# Patient Record
Sex: Male | Born: 1940 | Race: White | Hispanic: No | Marital: Single | Smoking: Former smoker
Health system: Southern US, Community
[De-identification: ages and names within clinical notes are randomized; demographics above are authoritative.]

## PROBLEM LIST (undated history)

## (undated) DIAGNOSIS — I2510 Atherosclerotic heart disease of native coronary artery without angina pectoris: Secondary | ICD-10-CM

## (undated) DIAGNOSIS — F329 Major depressive disorder, single episode, unspecified: Secondary | ICD-10-CM

## (undated) DIAGNOSIS — G4700 Insomnia, unspecified: Secondary | ICD-10-CM

## (undated) DIAGNOSIS — I10 Essential (primary) hypertension: Secondary | ICD-10-CM

## (undated) DIAGNOSIS — M1990 Unspecified osteoarthritis, unspecified site: Secondary | ICD-10-CM

## (undated) DIAGNOSIS — M5126 Other intervertebral disc displacement, lumbar region: Secondary | ICD-10-CM

## (undated) DIAGNOSIS — K635 Polyp of colon: Secondary | ICD-10-CM

## (undated) DIAGNOSIS — E785 Hyperlipidemia, unspecified: Secondary | ICD-10-CM

## (undated) DIAGNOSIS — I219 Acute myocardial infarction, unspecified: Secondary | ICD-10-CM

## (undated) DIAGNOSIS — K219 Gastro-esophageal reflux disease without esophagitis: Secondary | ICD-10-CM

## (undated) DIAGNOSIS — F32A Depression, unspecified: Secondary | ICD-10-CM

## (undated) HISTORY — DX: Insomnia, unspecified: G47.00

## (undated) HISTORY — PX: APPENDECTOMY: SHX54

## (undated) HISTORY — PX: OTHER SURGICAL HISTORY: SHX169

## (undated) HISTORY — DX: Atherosclerotic heart disease of native coronary artery without angina pectoris: I25.10

## (undated) HISTORY — DX: Depression, unspecified: F32.A

## (undated) HISTORY — PX: CARDIAC SURGERY: SHX584

## (undated) HISTORY — DX: Hyperlipidemia, unspecified: E78.5

## (undated) HISTORY — PX: HERNIA REPAIR: SHX51

## (undated) HISTORY — DX: Major depressive disorder, single episode, unspecified: F32.9

## (undated) HISTORY — DX: Polyp of colon: K63.5

---

## 1997-10-11 ENCOUNTER — Ambulatory Visit (HOSPITAL_COMMUNITY): Admission: RE | Admit: 1997-10-11 | Discharge: 1997-10-11 | Payer: Self-pay | Admitting: *Deleted

## 2001-07-10 ENCOUNTER — Ambulatory Visit (HOSPITAL_COMMUNITY): Admission: RE | Admit: 2001-07-10 | Discharge: 2001-07-10 | Payer: Self-pay | Admitting: Family Medicine

## 2001-07-10 ENCOUNTER — Encounter: Payer: Self-pay | Admitting: Family Medicine

## 2001-07-19 ENCOUNTER — Ambulatory Visit (HOSPITAL_COMMUNITY): Admission: RE | Admit: 2001-07-19 | Discharge: 2001-07-19 | Payer: Self-pay | Admitting: Family Medicine

## 2001-07-19 ENCOUNTER — Encounter: Payer: Self-pay | Admitting: Family Medicine

## 2001-10-10 ENCOUNTER — Ambulatory Visit (HOSPITAL_COMMUNITY): Admission: RE | Admit: 2001-10-10 | Discharge: 2001-10-10 | Payer: Self-pay | Admitting: Internal Medicine

## 2002-08-16 ENCOUNTER — Ambulatory Visit (HOSPITAL_COMMUNITY): Admission: RE | Admit: 2002-08-16 | Discharge: 2002-08-16 | Payer: Self-pay | Admitting: Internal Medicine

## 2002-08-16 ENCOUNTER — Encounter (INDEPENDENT_AMBULATORY_CARE_PROVIDER_SITE_OTHER): Payer: Self-pay | Admitting: Internal Medicine

## 2002-08-22 ENCOUNTER — Ambulatory Visit (HOSPITAL_COMMUNITY): Admission: RE | Admit: 2002-08-22 | Discharge: 2002-08-22 | Payer: Self-pay | Admitting: Internal Medicine

## 2002-08-28 ENCOUNTER — Encounter (INDEPENDENT_AMBULATORY_CARE_PROVIDER_SITE_OTHER): Payer: Self-pay | Admitting: Internal Medicine

## 2002-08-28 ENCOUNTER — Ambulatory Visit (HOSPITAL_COMMUNITY): Admission: RE | Admit: 2002-08-28 | Discharge: 2002-08-28 | Payer: Self-pay | Admitting: Internal Medicine

## 2002-10-24 ENCOUNTER — Ambulatory Visit: Admission: RE | Admit: 2002-10-24 | Discharge: 2002-10-24 | Payer: Self-pay | Admitting: Orthopedic Surgery

## 2002-10-24 ENCOUNTER — Encounter: Payer: Self-pay | Admitting: Orthopedic Surgery

## 2002-10-25 ENCOUNTER — Encounter: Payer: Self-pay | Admitting: Orthopedic Surgery

## 2002-10-25 ENCOUNTER — Ambulatory Visit (HOSPITAL_COMMUNITY): Admission: RE | Admit: 2002-10-25 | Discharge: 2002-10-25 | Payer: Self-pay | Admitting: Orthopedic Surgery

## 2002-11-14 ENCOUNTER — Encounter: Admission: RE | Admit: 2002-11-14 | Discharge: 2002-11-14 | Payer: Self-pay | Admitting: Orthopedic Surgery

## 2002-11-14 ENCOUNTER — Encounter: Payer: Self-pay | Admitting: Orthopedic Surgery

## 2002-11-14 ENCOUNTER — Encounter: Payer: Self-pay | Admitting: Diagnostic Radiology

## 2002-12-07 ENCOUNTER — Encounter: Admission: RE | Admit: 2002-12-07 | Discharge: 2002-12-07 | Payer: Self-pay | Admitting: Orthopedic Surgery

## 2002-12-07 ENCOUNTER — Encounter: Payer: Self-pay | Admitting: Orthopedic Surgery

## 2003-01-18 ENCOUNTER — Encounter: Payer: Self-pay | Admitting: Emergency Medicine

## 2003-01-18 ENCOUNTER — Emergency Department (HOSPITAL_COMMUNITY): Admission: EM | Admit: 2003-01-18 | Discharge: 2003-01-18 | Payer: Self-pay | Admitting: Emergency Medicine

## 2003-02-14 ENCOUNTER — Ambulatory Visit (HOSPITAL_COMMUNITY): Admission: RE | Admit: 2003-02-14 | Discharge: 2003-02-14 | Payer: Self-pay | Admitting: Internal Medicine

## 2003-02-15 ENCOUNTER — Ambulatory Visit (HOSPITAL_COMMUNITY): Admission: RE | Admit: 2003-02-15 | Discharge: 2003-02-15 | Payer: Self-pay | Admitting: Family Medicine

## 2003-03-11 ENCOUNTER — Ambulatory Visit (HOSPITAL_COMMUNITY): Admission: RE | Admit: 2003-03-11 | Discharge: 2003-03-11 | Payer: Self-pay | Admitting: Family Medicine

## 2004-04-13 ENCOUNTER — Ambulatory Visit (HOSPITAL_COMMUNITY): Admission: RE | Admit: 2004-04-13 | Discharge: 2004-04-13 | Payer: Self-pay | Admitting: Family Medicine

## 2004-10-05 ENCOUNTER — Ambulatory Visit (HOSPITAL_COMMUNITY)
Admission: RE | Admit: 2004-10-05 | Discharge: 2004-10-05 | Payer: Self-pay | Admitting: Physical Medicine and Rehabilitation

## 2006-11-28 ENCOUNTER — Ambulatory Visit: Payer: Self-pay | Admitting: Orthopedic Surgery

## 2006-12-07 ENCOUNTER — Ambulatory Visit: Payer: Self-pay | Admitting: Internal Medicine

## 2008-09-13 ENCOUNTER — Ambulatory Visit (HOSPITAL_COMMUNITY): Admission: RE | Admit: 2008-09-13 | Discharge: 2008-09-13 | Payer: Self-pay | Admitting: Family Medicine

## 2008-10-04 ENCOUNTER — Ambulatory Visit (HOSPITAL_COMMUNITY): Admission: RE | Admit: 2008-10-04 | Discharge: 2008-10-04 | Payer: Self-pay | Admitting: Urology

## 2008-11-04 ENCOUNTER — Encounter (INDEPENDENT_AMBULATORY_CARE_PROVIDER_SITE_OTHER): Payer: Self-pay | Admitting: *Deleted

## 2008-12-08 ENCOUNTER — Emergency Department (HOSPITAL_COMMUNITY): Admission: EM | Admit: 2008-12-08 | Discharge: 2008-12-08 | Payer: Self-pay | Admitting: Emergency Medicine

## 2008-12-20 DIAGNOSIS — F17200 Nicotine dependence, unspecified, uncomplicated: Secondary | ICD-10-CM

## 2008-12-20 DIAGNOSIS — D126 Benign neoplasm of colon, unspecified: Secondary | ICD-10-CM

## 2008-12-20 DIAGNOSIS — R1310 Dysphagia, unspecified: Secondary | ICD-10-CM | POA: Insufficient documentation

## 2008-12-20 DIAGNOSIS — K5909 Other constipation: Secondary | ICD-10-CM | POA: Insufficient documentation

## 2008-12-20 DIAGNOSIS — M545 Low back pain: Secondary | ICD-10-CM

## 2008-12-20 DIAGNOSIS — Z87448 Personal history of other diseases of urinary system: Secondary | ICD-10-CM

## 2008-12-20 DIAGNOSIS — K219 Gastro-esophageal reflux disease without esophagitis: Secondary | ICD-10-CM

## 2008-12-20 DIAGNOSIS — M519 Unspecified thoracic, thoracolumbar and lumbosacral intervertebral disc disorder: Secondary | ICD-10-CM

## 2009-04-05 DIAGNOSIS — I219 Acute myocardial infarction, unspecified: Secondary | ICD-10-CM

## 2009-04-05 HISTORY — DX: Acute myocardial infarction, unspecified: I21.9

## 2009-04-05 HISTORY — PX: CORONARY ARTERY BYPASS GRAFT: SHX141

## 2009-04-11 ENCOUNTER — Ambulatory Visit (HOSPITAL_COMMUNITY): Admission: RE | Admit: 2009-04-11 | Discharge: 2009-04-11 | Payer: Self-pay | Admitting: Cardiology

## 2009-04-17 ENCOUNTER — Encounter: Payer: Self-pay | Admitting: Cardiothoracic Surgery

## 2009-04-17 ENCOUNTER — Ambulatory Visit: Payer: Self-pay | Admitting: Cardiothoracic Surgery

## 2009-04-17 ENCOUNTER — Inpatient Hospital Stay (HOSPITAL_COMMUNITY): Admission: RE | Admit: 2009-04-17 | Discharge: 2009-04-18 | Payer: Self-pay | Admitting: Cardiology

## 2009-04-17 HISTORY — PX: CARDIAC CATHETERIZATION: SHX172

## 2009-07-14 ENCOUNTER — Emergency Department (HOSPITAL_COMMUNITY): Admission: EM | Admit: 2009-07-14 | Discharge: 2009-07-14 | Payer: Self-pay | Admitting: Emergency Medicine

## 2009-07-17 ENCOUNTER — Ambulatory Visit (HOSPITAL_COMMUNITY): Admission: RE | Admit: 2009-07-17 | Discharge: 2009-07-17 | Payer: Self-pay | Admitting: Cardiology

## 2009-07-23 ENCOUNTER — Ambulatory Visit: Payer: Self-pay | Admitting: Cardiothoracic Surgery

## 2009-08-04 ENCOUNTER — Inpatient Hospital Stay (HOSPITAL_COMMUNITY): Admission: RE | Admit: 2009-08-04 | Discharge: 2009-08-13 | Payer: Self-pay | Admitting: Cardiothoracic Surgery

## 2009-08-04 ENCOUNTER — Ambulatory Visit: Payer: Self-pay | Admitting: Cardiothoracic Surgery

## 2009-08-04 ENCOUNTER — Encounter: Payer: Self-pay | Admitting: Cardiothoracic Surgery

## 2009-09-15 ENCOUNTER — Ambulatory Visit: Payer: Self-pay | Admitting: Cardiothoracic Surgery

## 2009-09-15 ENCOUNTER — Encounter: Admission: RE | Admit: 2009-09-15 | Discharge: 2009-09-15 | Payer: Self-pay | Admitting: Cardiothoracic Surgery

## 2009-10-08 ENCOUNTER — Ambulatory Visit: Payer: Self-pay | Admitting: Cardiothoracic Surgery

## 2009-10-08 ENCOUNTER — Encounter: Admission: RE | Admit: 2009-10-08 | Discharge: 2009-10-08 | Payer: Self-pay | Admitting: Cardiothoracic Surgery

## 2009-10-20 ENCOUNTER — Encounter (HOSPITAL_COMMUNITY): Admission: RE | Admit: 2009-10-20 | Discharge: 2009-11-19 | Payer: Self-pay | Admitting: Cardiology

## 2009-11-19 ENCOUNTER — Encounter (HOSPITAL_COMMUNITY): Admission: RE | Admit: 2009-11-19 | Discharge: 2009-12-19 | Payer: Self-pay | Admitting: Cardiology

## 2009-12-03 ENCOUNTER — Encounter: Admission: RE | Admit: 2009-12-03 | Discharge: 2009-12-03 | Payer: Self-pay | Admitting: Cardiothoracic Surgery

## 2009-12-03 ENCOUNTER — Ambulatory Visit: Payer: Self-pay | Admitting: Cardiothoracic Surgery

## 2009-12-19 ENCOUNTER — Encounter (HOSPITAL_COMMUNITY)
Admission: RE | Admit: 2009-12-19 | Discharge: 2010-01-18 | Payer: Self-pay | Source: Home / Self Care | Admitting: Cardiology

## 2010-01-19 ENCOUNTER — Encounter (HOSPITAL_COMMUNITY)
Admission: RE | Admit: 2010-01-19 | Discharge: 2010-02-18 | Payer: Self-pay | Source: Home / Self Care | Admitting: Cardiology

## 2010-04-25 ENCOUNTER — Encounter: Payer: Self-pay | Admitting: Family Medicine

## 2010-04-26 ENCOUNTER — Encounter: Payer: Self-pay | Admitting: Cardiothoracic Surgery

## 2010-06-07 ENCOUNTER — Emergency Department (HOSPITAL_COMMUNITY): Payer: Medicare Other

## 2010-06-07 ENCOUNTER — Emergency Department (HOSPITAL_COMMUNITY)
Admission: EM | Admit: 2010-06-07 | Discharge: 2010-06-07 | Disposition: A | Discharge status: Home / Self Care | Payer: Medicare Other | Attending: Emergency Medicine | Admitting: Emergency Medicine

## 2010-06-07 DIAGNOSIS — M549 Dorsalgia, unspecified: Secondary | ICD-10-CM | POA: Insufficient documentation

## 2010-06-07 DIAGNOSIS — F1920 Other psychoactive substance dependence, uncomplicated: Secondary | ICD-10-CM | POA: Insufficient documentation

## 2010-06-07 DIAGNOSIS — G8929 Other chronic pain: Secondary | ICD-10-CM | POA: Insufficient documentation

## 2010-06-07 DIAGNOSIS — Z79899 Other long term (current) drug therapy: Secondary | ICD-10-CM | POA: Insufficient documentation

## 2010-06-07 DIAGNOSIS — E785 Hyperlipidemia, unspecified: Secondary | ICD-10-CM | POA: Insufficient documentation

## 2010-06-07 DIAGNOSIS — R110 Nausea: Secondary | ICD-10-CM | POA: Insufficient documentation

## 2010-06-07 DIAGNOSIS — F19939 Other psychoactive substance use, unspecified with withdrawal, unspecified: Secondary | ICD-10-CM | POA: Insufficient documentation

## 2010-06-07 DIAGNOSIS — I2510 Atherosclerotic heart disease of native coronary artery without angina pectoris: Secondary | ICD-10-CM | POA: Insufficient documentation

## 2010-06-07 LAB — URINE MICROSCOPIC-ADD ON

## 2010-06-07 LAB — URINALYSIS, ROUTINE W REFLEX MICROSCOPIC
Bilirubin Urine: NEGATIVE
Glucose, UA: NEGATIVE mg/dL
Specific Gravity, Urine: 1.02 (ref 1.005–1.030)
Urobilinogen, UA: 0.2 mg/dL (ref 0.0–1.0)

## 2010-06-07 LAB — DIFFERENTIAL
Basophils Absolute: 0 10*3/uL (ref 0.0–0.1)
Basophils Relative: 0 % (ref 0–1)
Lymphocytes Relative: 15 % (ref 12–46)
Neutro Abs: 9.6 10*3/uL — ABNORMAL HIGH (ref 1.7–7.7)
Neutrophils Relative %: 79 % — ABNORMAL HIGH (ref 43–77)

## 2010-06-07 LAB — CBC
HCT: 42.6 % (ref 39.0–52.0)
Hemoglobin: 14.6 g/dL (ref 13.0–17.0)
RDW: 14.1 % (ref 11.5–15.5)
WBC: 12.2 10*3/uL — ABNORMAL HIGH (ref 4.0–10.5)

## 2010-06-07 LAB — BASIC METABOLIC PANEL
CO2: 25 mEq/L (ref 19–32)
GFR calc Af Amer: >60 mL/min (ref >60)
GFR calc non Af Amer: >60 mL/min (ref >60)
Glucose, Bld: 128 mg/dL — ABNORMAL HIGH (ref 70–99)
Potassium: 4.2 mEq/L (ref 3.5–5.1)
Sodium: 134 mEq/L — ABNORMAL LOW (ref 135–145)

## 2010-06-07 LAB — POCT CARDIAC MARKERS
CKMB, poc: <1 ng/mL — ABNORMAL LOW (ref 1.0–8.0)
Myoglobin, poc: 38.2 ng/mL (ref 12–200)
Troponin i, poc: 0.16 ng/mL — ABNORMAL HIGH (ref 0.00–0.09)

## 2010-06-07 LAB — CK TOTAL AND CKMB (NOT AT ARMC): CK, MB: 2.2 ng/mL (ref 0.3–4.0)

## 2010-06-21 LAB — CBC
HCT: 42.7 % (ref 39.0–52.0)
Hemoglobin: 14.5 g/dL (ref 13.0–17.0)
MCV: 93.1 fL (ref 78.0–100.0)
Platelets: 221 10*3/uL (ref 150–400)
RBC: 4.59 MIL/uL (ref 4.22–5.81)
WBC: 10.3 10*3/uL (ref 4.0–10.5)

## 2010-06-21 LAB — HEPATIC FUNCTION PANEL
ALT: 18 U/L (ref 0–53)
AST: 16 U/L (ref 0–37)
Albumin: 3.8 g/dL (ref 3.5–5.2)
Bilirubin, Direct: 0.2 mg/dL (ref 0.0–0.3)
Total Protein: 6.8 g/dL (ref 6.0–8.3)

## 2010-06-21 LAB — BLOOD GAS, ARTERIAL
Acid-Base Excess: 0.4 mmol/L (ref 0.0–2.0)
Bicarbonate: 24.5 mEq/L — ABNORMAL HIGH (ref 20.0–24.0)
TCO2: 25.7 mmol/L (ref 0–100)
pCO2 arterial: 39 mmHg (ref 35.0–45.0)
pH, Arterial: 7.414 (ref 7.350–7.450)
pO2, Arterial: 72 mmHg — ABNORMAL LOW (ref 80.0–100.0)

## 2010-06-21 LAB — BASIC METABOLIC PANEL
BUN: 14 mg/dL (ref 6–23)
CO2: 27 mEq/L (ref 19–32)
Chloride: 102 mEq/L (ref 96–112)
Creatinine, Ser: 0.9 mg/dL (ref 0.4–1.5)
Potassium: 4.3 mEq/L (ref 3.5–5.1)

## 2010-06-23 LAB — POCT I-STAT 3, ART BLOOD GAS (G3+)
Acid-Base Excess: 1 mmol/L (ref 0.0–2.0)
Acid-Base Excess: 2 mmol/L (ref 0.0–2.0)
Acid-base deficit: 2 mmol/L (ref 0.0–2.0)
Acid-base deficit: 3 mmol/L — ABNORMAL HIGH (ref 0.0–2.0)
Acid-base deficit: 3 mmol/L — ABNORMAL HIGH (ref 0.0–2.0)
Bicarbonate: 21.1 mEq/L (ref 20.0–24.0)
Bicarbonate: 22.1 mEq/L (ref 20.0–24.0)
Bicarbonate: 24.6 mEq/L — ABNORMAL HIGH (ref 20.0–24.0)
Bicarbonate: 25 mEq/L — ABNORMAL HIGH (ref 20.0–24.0)
Bicarbonate: 26.1 mEq/L — ABNORMAL HIGH (ref 20.0–24.0)
Bicarbonate: 26.3 mEq/L — ABNORMAL HIGH (ref 20.0–24.0)
O2 Saturation: 100 %
O2 Saturation: 100 %
O2 Saturation: 91 %
O2 Saturation: 92 %
O2 Saturation: 95 %
O2 Saturation: 98 %
Patient temperature: 35.1
Patient temperature: 37.5
Patient temperature: 99.1
Patient temperature: 99.8
TCO2: 22 mmol/L (ref 0–100)
TCO2: 23 mmol/L (ref 0–100)
TCO2: 26 mmol/L (ref 0–100)
TCO2: 26 mmol/L (ref 0–100)
TCO2: 27 mmol/L (ref 0–100)
TCO2: 28 mmol/L (ref 0–100)
pCO2 arterial: 29.9 mmHg — ABNORMAL LOW (ref 35.0–45.0)
pCO2 arterial: 35.6 mmHg (ref 35.0–45.0)
pCO2 arterial: 40.6 mmHg (ref 35.0–45.0)
pCO2 arterial: 43.8 mmHg (ref 35.0–45.0)
pCO2 arterial: 44.2 mmHg (ref 35.0–45.0)
pCO2 arterial: 46.5 mmHg — ABNORMAL HIGH (ref 35.0–45.0)
pH, Arterial: 7.345 — ABNORMAL LOW (ref 7.350–7.450)
pH, Arterial: 7.345 — ABNORMAL LOW (ref 7.350–7.450)
pH, Arterial: 7.356 (ref 7.350–7.450)
pH, Arterial: 7.383 (ref 7.350–7.450)
pH, Arterial: 7.386 (ref 7.350–7.450)
pH, Arterial: 7.531 — ABNORMAL HIGH (ref 7.350–7.450)
pO2, Arterial: 100 mmHg (ref 80.0–100.0)
pO2, Arterial: 306 mmHg — ABNORMAL HIGH (ref 80.0–100.0)
pO2, Arterial: 351 mmHg — ABNORMAL HIGH (ref 80.0–100.0)
pO2, Arterial: 62 mmHg — ABNORMAL LOW (ref 80.0–100.0)
pO2, Arterial: 65 mmHg — ABNORMAL LOW (ref 80.0–100.0)
pO2, Arterial: 83 mmHg (ref 80.0–100.0)

## 2010-06-23 LAB — APTT
aPTT: 32 seconds (ref 24–37)
aPTT: 30 seconds (ref 24–37)

## 2010-06-23 LAB — CROSSMATCH
ABO/RH(D): A POS
Antibody Screen: NEGATIVE

## 2010-06-23 LAB — URINE CULTURE
Colony Count: NO GROWTH
Culture: NO GROWTH

## 2010-06-23 LAB — POCT I-STAT 4, (NA,K, GLUC, HGB,HCT)
Glucose, Bld: 114 mg/dL — ABNORMAL HIGH (ref 70–99)
Glucose, Bld: 133 mg/dL — ABNORMAL HIGH (ref 70–99)
Glucose, Bld: 147 mg/dL — ABNORMAL HIGH (ref 70–99)
Glucose, Bld: 87 mg/dL (ref 70–99)
Glucose, Bld: 92 mg/dL (ref 70–99)
Glucose, Bld: 96 mg/dL (ref 70–99)
HCT: 29 % — ABNORMAL LOW (ref 39.0–52.0)
HCT: 31 % — ABNORMAL LOW (ref 39.0–52.0)
HCT: 32 % — ABNORMAL LOW (ref 39.0–52.0)
HCT: 35 % — ABNORMAL LOW (ref 39.0–52.0)
HCT: 36 % — ABNORMAL LOW (ref 39.0–52.0)
HCT: 39 % (ref 39.0–52.0)
Hemoglobin: 10.5 g/dL — ABNORMAL LOW (ref 13.0–17.0)
Hemoglobin: 10.9 g/dL — ABNORMAL LOW (ref 13.0–17.0)
Hemoglobin: 11.9 g/dL — ABNORMAL LOW (ref 13.0–17.0)
Hemoglobin: 12.2 g/dL — ABNORMAL LOW (ref 13.0–17.0)
Hemoglobin: 13.3 g/dL (ref 13.0–17.0)
Hemoglobin: 9.9 g/dL — ABNORMAL LOW (ref 13.0–17.0)
Potassium: 3.1 mEq/L — ABNORMAL LOW (ref 3.5–5.1)
Potassium: 3.6 mEq/L (ref 3.5–5.1)
Potassium: 3.9 mEq/L (ref 3.5–5.1)
Potassium: 4 mEq/L (ref 3.5–5.1)
Potassium: 4.3 mEq/L (ref 3.5–5.1)
Potassium: 4.3 mEq/L (ref 3.5–5.1)
Sodium: 131 mEq/L — ABNORMAL LOW (ref 135–145)
Sodium: 134 mEq/L — ABNORMAL LOW (ref 135–145)
Sodium: 136 mEq/L (ref 135–145)
Sodium: 136 mEq/L (ref 135–145)
Sodium: 137 mEq/L (ref 135–145)
Sodium: 139 mEq/L (ref 135–145)

## 2010-06-23 LAB — COMPREHENSIVE METABOLIC PANEL
ALT: 20 U/L (ref 0–53)
AST: 17 U/L (ref 0–37)
Albumin: 3.6 g/dL (ref 3.5–5.2)
Alkaline Phosphatase: 66 U/L (ref 39–117)
BUN: 10 mg/dL (ref 6–23)
CO2: 24 mEq/L (ref 19–32)
Calcium: 9.2 mg/dL (ref 8.4–10.5)
Chloride: 105 mEq/L (ref 96–112)
Creatinine, Ser: 0.97 mg/dL (ref 0.4–1.5)
GFR calc Af Amer: >60 mL/min (ref >60)
GFR calc non Af Amer: >60 mL/min (ref >60)
Glucose, Bld: 94 mg/dL (ref 70–99)
Potassium: 4.6 mEq/L (ref 3.5–5.1)
Sodium: 134 mEq/L — ABNORMAL LOW (ref 135–145)
Total Bilirubin: 0.3 mg/dL (ref 0.3–1.2)
Total Protein: 6.2 g/dL (ref 6.0–8.3)

## 2010-06-23 LAB — URINALYSIS, ROUTINE W REFLEX MICROSCOPIC
Bilirubin Urine: NEGATIVE
Bilirubin Urine: NEGATIVE
Glucose, UA: NEGATIVE mg/dL
Glucose, UA: NEGATIVE mg/dL
Ketones, ur: 40 mg/dL — AB
Ketones, ur: NEGATIVE mg/dL
Leukocytes, UA: NEGATIVE
Nitrite: NEGATIVE
Nitrite: NEGATIVE
Protein, ur: 100 mg/dL — AB
Protein, ur: NEGATIVE mg/dL
Specific Gravity, Urine: 1.028 (ref 1.005–1.030)
Specific Gravity, Urine: 1.011 (ref 1.005–1.030)
Urobilinogen, UA: 0.2 mg/dL (ref 0.0–1.0)
Urobilinogen, UA: 0.2 mg/dL (ref 0.0–1.0)
pH: 5.5 (ref 5.0–8.0)
pH: 6.5 (ref 5.0–8.0)

## 2010-06-23 LAB — CBC
HCT: 24.1 % — ABNORMAL LOW (ref 39.0–52.0)
HCT: 24.9 % — ABNORMAL LOW (ref 39.0–52.0)
HCT: 26.4 % — ABNORMAL LOW (ref 39.0–52.0)
HCT: 27.8 % — ABNORMAL LOW (ref 39.0–52.0)
HCT: 29.3 % — ABNORMAL LOW (ref 39.0–52.0)
HCT: 29.3 % — ABNORMAL LOW (ref 39.0–52.0)
HCT: 30.8 % — ABNORMAL LOW (ref 39.0–52.0)
HCT: 36.2 % — ABNORMAL LOW (ref 39.0–52.0)
HCT: 40.7 % (ref 39.0–52.0)
Hemoglobin: 10.1 g/dL — ABNORMAL LOW (ref 13.0–17.0)
Hemoglobin: 10.3 g/dL — ABNORMAL LOW (ref 13.0–17.0)
Hemoglobin: 10.6 g/dL — ABNORMAL LOW (ref 13.0–17.0)
Hemoglobin: 12.6 g/dL — ABNORMAL LOW (ref 13.0–17.0)
Hemoglobin: 8.2 g/dL — ABNORMAL LOW (ref 13.0–17.0)
Hemoglobin: 8.3 g/dL — ABNORMAL LOW (ref 13.0–17.0)
Hemoglobin: 8.4 g/dL — ABNORMAL LOW (ref 13.0–17.0)
Hemoglobin: 9.1 g/dL — ABNORMAL LOW (ref 13.0–17.0)
Hemoglobin: 9.7 g/dL — ABNORMAL LOW (ref 13.0–17.0)
Hemoglobin: 14.3 g/dL (ref 13.0–17.0)
MCHC: 33.6 g/dL (ref 30.0–36.0)
MCHC: 34 g/dL (ref 30.0–36.0)
MCHC: 34.3 g/dL (ref 30.0–36.0)
MCHC: 34.3 g/dL (ref 30.0–36.0)
MCHC: 34.3 g/dL (ref 30.0–36.0)
MCHC: 34.6 g/dL (ref 30.0–36.0)
MCHC: 34.7 g/dL (ref 30.0–36.0)
MCHC: 34.8 g/dL (ref 30.0–36.0)
MCHC: 35.2 g/dL (ref 30.0–36.0)
MCHC: 35.2 g/dL (ref 30.0–36.0)
MCV: 92.2 fL (ref 78.0–100.0)
MCV: 92.5 fL (ref 78.0–100.0)
MCV: 92.8 fL (ref 78.0–100.0)
MCV: 92.9 fL (ref 78.0–100.0)
MCV: 93 fL (ref 78.0–100.0)
MCV: 93.2 fL (ref 78.0–100.0)
MCV: 93.3 fL (ref 78.0–100.0)
MCV: 93.6 fL (ref 78.0–100.0)
MCV: 94.1 fL (ref 78.0–100.0)
MCV: 92.6 fL (ref 78.0–100.0)
Platelets: 153 10*3/uL (ref 150–400)
Platelets: 162 10*3/uL (ref 150–400)
Platelets: 174 10*3/uL (ref 150–400)
Platelets: 194 10*3/uL (ref 150–400)
Platelets: 200 10*3/uL (ref 150–400)
Platelets: 206 10*3/uL (ref 150–400)
Platelets: 210 10*3/uL (ref 150–400)
Platelets: 229 10*3/uL (ref 150–400)
Platelets: 360 10*3/uL (ref 150–400)
Platelets: 249 10*3/uL (ref 150–400)
RBC: 2.57 MIL/uL — ABNORMAL LOW (ref 4.22–5.81)
RBC: 2.58 MIL/uL — ABNORMAL LOW (ref 4.22–5.81)
RBC: 2.67 MIL/uL — ABNORMAL LOW (ref 4.22–5.81)
RBC: 2.83 MIL/uL — ABNORMAL LOW (ref 4.22–5.81)
RBC: 2.97 MIL/uL — ABNORMAL LOW (ref 4.22–5.81)
RBC: 3.02 MIL/uL — ABNORMAL LOW (ref 4.22–5.81)
RBC: 3.15 MIL/uL — ABNORMAL LOW (ref 4.22–5.81)
RBC: 3.16 MIL/uL — ABNORMAL LOW (ref 4.22–5.81)
RBC: 3.32 MIL/uL — ABNORMAL LOW (ref 4.22–5.81)
RBC: 3.92 MIL/uL — ABNORMAL LOW (ref 4.22–5.81)
RBC: 4.39 MIL/uL (ref 4.22–5.81)
RDW: 13.2 % (ref 11.5–15.5)
RDW: 13.4 % (ref 11.5–15.5)
RDW: 13.4 % (ref 11.5–15.5)
RDW: 13.5 % (ref 11.5–15.5)
RDW: 13.6 % (ref 11.5–15.5)
RDW: 13.7 % (ref 11.5–15.5)
RDW: 13.8 % (ref 11.5–15.5)
RDW: 13.9 % (ref 11.5–15.5)
RDW: 13.6 % (ref 11.5–15.5)
WBC: 10 10*3/uL (ref 4.0–10.5)
WBC: 10.4 10*3/uL (ref 4.0–10.5)
WBC: 14.9 10*3/uL — ABNORMAL HIGH (ref 4.0–10.5)
WBC: 15.3 10*3/uL — ABNORMAL HIGH (ref 4.0–10.5)
WBC: 16.5 10*3/uL — ABNORMAL HIGH (ref 4.0–10.5)
WBC: 18.1 10*3/uL — ABNORMAL HIGH (ref 4.0–10.5)
WBC: 18.3 10*3/uL — ABNORMAL HIGH (ref 4.0–10.5)
WBC: 21.8 10*3/uL — ABNORMAL HIGH (ref 4.0–10.5)
WBC: 8.5 10*3/uL (ref 4.0–10.5)
WBC: 8.6 10*3/uL (ref 4.0–10.5)
WBC: 8.8 10*3/uL (ref 4.0–10.5)

## 2010-06-23 LAB — BASIC METABOLIC PANEL
BUN: 10 mg/dL (ref 6–23)
BUN: 12 mg/dL (ref 6–23)
BUN: 12 mg/dL (ref 6–23)
BUN: 16 mg/dL (ref 6–23)
BUN: 21 mg/dL (ref 6–23)
BUN: 23 mg/dL (ref 6–23)
CO2: 22 mEq/L (ref 19–32)
CO2: 25 mEq/L (ref 19–32)
CO2: 26 mEq/L (ref 19–32)
CO2: 27 mEq/L (ref 19–32)
CO2: 28 mEq/L (ref 19–32)
Calcium: 7.7 mg/dL — ABNORMAL LOW (ref 8.4–10.5)
Calcium: 7.8 mg/dL — ABNORMAL LOW (ref 8.4–10.5)
Calcium: 7.9 mg/dL — ABNORMAL LOW (ref 8.4–10.5)
Calcium: 8 mg/dL — ABNORMAL LOW (ref 8.4–10.5)
Calcium: 8 mg/dL — ABNORMAL LOW (ref 8.4–10.5)
Calcium: 8.2 mg/dL — ABNORMAL LOW (ref 8.4–10.5)
Calcium: 8.8 mg/dL (ref 8.4–10.5)
Chloride: 103 mEq/L (ref 96–112)
Chloride: 103 mEq/L (ref 96–112)
Chloride: 104 mEq/L (ref 96–112)
Chloride: 104 mEq/L (ref 96–112)
Chloride: 107 mEq/L (ref 96–112)
Chloride: 110 mEq/L (ref 96–112)
Creatinine, Ser: 0.74 mg/dL (ref 0.4–1.5)
Creatinine, Ser: 0.81 mg/dL (ref 0.4–1.5)
Creatinine, Ser: 0.83 mg/dL (ref 0.4–1.5)
Creatinine, Ser: 0.84 mg/dL (ref 0.4–1.5)
Creatinine, Ser: 0.85 mg/dL (ref 0.4–1.5)
Creatinine, Ser: 0.88 mg/dL (ref 0.4–1.5)
Creatinine, Ser: 0.89 mg/dL (ref 0.4–1.5)
GFR calc Af Amer: >60 mL/min (ref >60)
GFR calc Af Amer: >60 mL/min (ref >60)
GFR calc Af Amer: >60 mL/min (ref >60)
GFR calc Af Amer: >60 mL/min (ref >60)
GFR calc Af Amer: >60 mL/min (ref >60)
GFR calc Af Amer: >60 mL/min (ref >60)
GFR calc Af Amer: >60 mL/min (ref >60)
GFR calc non Af Amer: >60 mL/min (ref >60)
GFR calc non Af Amer: >60 mL/min (ref >60)
GFR calc non Af Amer: >60 mL/min (ref >60)
GFR calc non Af Amer: >60 mL/min (ref >60)
GFR calc non Af Amer: >60 mL/min (ref >60)
GFR calc non Af Amer: >60 mL/min (ref >60)
Glucose, Bld: 112 mg/dL — ABNORMAL HIGH (ref 70–99)
Glucose, Bld: 115 mg/dL — ABNORMAL HIGH (ref 70–99)
Glucose, Bld: 123 mg/dL — ABNORMAL HIGH (ref 70–99)
Glucose, Bld: 124 mg/dL — ABNORMAL HIGH (ref 70–99)
Glucose, Bld: 149 mg/dL — ABNORMAL HIGH (ref 70–99)
Potassium: 2.8 mEq/L — ABNORMAL LOW (ref 3.5–5.1)
Potassium: 3 mEq/L — ABNORMAL LOW (ref 3.5–5.1)
Potassium: 3.2 mEq/L — ABNORMAL LOW (ref 3.5–5.1)
Potassium: 4 mEq/L (ref 3.5–5.1)
Potassium: 4.2 mEq/L (ref 3.5–5.1)
Sodium: 134 mEq/L — ABNORMAL LOW (ref 135–145)
Sodium: 136 mEq/L (ref 135–145)
Sodium: 137 mEq/L (ref 135–145)
Sodium: 137 mEq/L (ref 135–145)
Sodium: 138 mEq/L (ref 135–145)
Sodium: 139 mEq/L (ref 135–145)

## 2010-06-23 LAB — URINE MICROSCOPIC-ADD ON

## 2010-06-23 LAB — PREPARE PLATELETS

## 2010-06-23 LAB — EXPECTORATED SPUTUM ASSESSMENT W GRAM STAIN, RFLX TO RESP C

## 2010-06-23 LAB — GLUCOSE, CAPILLARY
Glucose-Capillary: 100 mg/dL — ABNORMAL HIGH (ref 70–99)
Glucose-Capillary: 104 mg/dL — ABNORMAL HIGH (ref 70–99)
Glucose-Capillary: 108 mg/dL — ABNORMAL HIGH (ref 70–99)
Glucose-Capillary: 108 mg/dL — ABNORMAL HIGH (ref 70–99)
Glucose-Capillary: 110 mg/dL — ABNORMAL HIGH (ref 70–99)
Glucose-Capillary: 114 mg/dL — ABNORMAL HIGH (ref 70–99)
Glucose-Capillary: 115 mg/dL — ABNORMAL HIGH (ref 70–99)
Glucose-Capillary: 121 mg/dL — ABNORMAL HIGH (ref 70–99)
Glucose-Capillary: 122 mg/dL — ABNORMAL HIGH (ref 70–99)
Glucose-Capillary: 122 mg/dL — ABNORMAL HIGH (ref 70–99)
Glucose-Capillary: 123 mg/dL — ABNORMAL HIGH (ref 70–99)
Glucose-Capillary: 123 mg/dL — ABNORMAL HIGH (ref 70–99)
Glucose-Capillary: 125 mg/dL — ABNORMAL HIGH (ref 70–99)
Glucose-Capillary: 125 mg/dL — ABNORMAL HIGH (ref 70–99)
Glucose-Capillary: 128 mg/dL — ABNORMAL HIGH (ref 70–99)
Glucose-Capillary: 129 mg/dL — ABNORMAL HIGH (ref 70–99)
Glucose-Capillary: 129 mg/dL — ABNORMAL HIGH (ref 70–99)
Glucose-Capillary: 129 mg/dL — ABNORMAL HIGH (ref 70–99)
Glucose-Capillary: 132 mg/dL — ABNORMAL HIGH (ref 70–99)
Glucose-Capillary: 133 mg/dL — ABNORMAL HIGH (ref 70–99)
Glucose-Capillary: 135 mg/dL — ABNORMAL HIGH (ref 70–99)
Glucose-Capillary: 136 mg/dL — ABNORMAL HIGH (ref 70–99)
Glucose-Capillary: 136 mg/dL — ABNORMAL HIGH (ref 70–99)
Glucose-Capillary: 137 mg/dL — ABNORMAL HIGH (ref 70–99)
Glucose-Capillary: 137 mg/dL — ABNORMAL HIGH (ref 70–99)
Glucose-Capillary: 137 mg/dL — ABNORMAL HIGH (ref 70–99)
Glucose-Capillary: 141 mg/dL — ABNORMAL HIGH (ref 70–99)
Glucose-Capillary: 150 mg/dL — ABNORMAL HIGH (ref 70–99)
Glucose-Capillary: 154 mg/dL — ABNORMAL HIGH (ref 70–99)
Glucose-Capillary: 157 mg/dL — ABNORMAL HIGH (ref 70–99)
Glucose-Capillary: 170 mg/dL — ABNORMAL HIGH (ref 70–99)
Glucose-Capillary: 172 mg/dL — ABNORMAL HIGH (ref 70–99)
Glucose-Capillary: 196 mg/dL — ABNORMAL HIGH (ref 70–99)
Glucose-Capillary: 90 mg/dL (ref 70–99)

## 2010-06-23 LAB — PROTIME-INR
INR: 1.37 (ref 0.00–1.49)
INR: 0.99 (ref 0.00–1.49)
Prothrombin Time: 16.8 seconds — ABNORMAL HIGH (ref 11.6–15.2)
Prothrombin Time: 13 seconds (ref 11.6–15.2)

## 2010-06-23 LAB — POCT I-STAT, CHEM 8
BUN: 10 mg/dL (ref 6–23)
BUN: 12 mg/dL (ref 6–23)
Calcium, Ion: 1.14 mmol/L (ref 1.12–1.32)
Calcium, Ion: 1.15 mmol/L (ref 1.12–1.32)
Chloride: 103 mEq/L (ref 96–112)
Chloride: 108 mEq/L (ref 96–112)
Creatinine, Ser: 0.8 mg/dL (ref 0.4–1.5)
Creatinine, Ser: 0.9 mg/dL (ref 0.4–1.5)
Glucose, Bld: 120 mg/dL — ABNORMAL HIGH (ref 70–99)
Glucose, Bld: 128 mg/dL — ABNORMAL HIGH (ref 70–99)
HCT: 29 % — ABNORMAL LOW (ref 39.0–52.0)
HCT: 30 % — ABNORMAL LOW (ref 39.0–52.0)
Hemoglobin: 10.2 g/dL — ABNORMAL LOW (ref 13.0–17.0)
Hemoglobin: 9.9 g/dL — ABNORMAL LOW (ref 13.0–17.0)
Potassium: 4.1 mEq/L (ref 3.5–5.1)
Potassium: 4.4 mEq/L (ref 3.5–5.1)
Sodium: 136 mEq/L (ref 135–145)
Sodium: 136 mEq/L (ref 135–145)
TCO2: 21 mmol/L (ref 0–100)
TCO2: 23 mmol/L (ref 0–100)

## 2010-06-23 LAB — CULTURE, RESPIRATORY W GRAM STAIN: Culture: NORMAL

## 2010-06-23 LAB — BLOOD GAS, ARTERIAL
Acid-Base Excess: 0.4 mmol/L (ref 0.0–2.0)
Bicarbonate: 24.6 mEq/L — ABNORMAL HIGH (ref 20.0–24.0)
Drawn by: 206361
FIO2: 0.21 %
O2 Saturation: 97.8 %
Patient temperature: 98.6
TCO2: 25.8 mmol/L (ref 0–100)
pCO2 arterial: 39.7 mmHg (ref 35.0–45.0)
pH, Arterial: 7.408 (ref 7.350–7.450)
pO2, Arterial: 92.4 mmHg (ref 80.0–100.0)

## 2010-06-23 LAB — CREATININE, SERUM
Creatinine, Ser: 0.85 mg/dL (ref 0.4–1.5)
Creatinine, Ser: 0.92 mg/dL (ref 0.4–1.5)
GFR calc Af Amer: >60 mL/min (ref >60)
GFR calc Af Amer: >60 mL/min (ref >60)
GFR calc non Af Amer: >60 mL/min (ref >60)
GFR calc non Af Amer: >60 mL/min (ref >60)

## 2010-06-23 LAB — PLATELET COUNT: Platelets: 136 10*3/uL — ABNORMAL LOW (ref 150–400)

## 2010-06-23 LAB — HEMOGLOBIN AND HEMATOCRIT, BLOOD
HCT: 32.1 % — ABNORMAL LOW (ref 39.0–52.0)
Hemoglobin: 11 g/dL — ABNORMAL LOW (ref 13.0–17.0)

## 2010-06-23 LAB — WOUND CULTURE: Culture: NO GROWTH

## 2010-06-23 LAB — MAGNESIUM
Magnesium: 2.4 mg/dL (ref 1.5–2.5)
Magnesium: 2.5 mg/dL (ref 1.5–2.5)
Magnesium: 2.7 mg/dL — ABNORMAL HIGH (ref 1.5–2.5)

## 2010-06-23 LAB — HEMOGLOBIN A1C
Hgb A1c MFr Bld: 5.9 % — ABNORMAL HIGH (ref <5.7)
Mean Plasma Glucose: 123 mg/dL — ABNORMAL HIGH (ref <117)

## 2010-06-23 LAB — SURGICAL PCR SCREEN
MRSA, PCR: NEGATIVE
Staphylococcus aureus: NEGATIVE

## 2010-06-23 LAB — ABO/RH: ABO/RH(D): A POS

## 2010-06-24 LAB — URINALYSIS, ROUTINE W REFLEX MICROSCOPIC
Bilirubin Urine: NEGATIVE
Ketones, ur: NEGATIVE mg/dL
Nitrite: NEGATIVE
Urobilinogen, UA: 0.2 mg/dL (ref 0.0–1.0)

## 2010-06-24 LAB — DIFFERENTIAL
Basophils Absolute: 0 10*3/uL (ref 0.0–0.1)
Basophils Relative: 0 % (ref 0–1)
Eosinophils Relative: 3 % (ref 0–5)
Monocytes Absolute: 0.7 10*3/uL (ref 0.1–1.0)

## 2010-06-24 LAB — CBC
HCT: 40.5 % (ref 39.0–52.0)
Hemoglobin: 14.1 g/dL (ref 13.0–17.0)
MCHC: 34.8 g/dL (ref 30.0–36.0)
RDW: 12.9 % (ref 11.5–15.5)

## 2010-06-24 LAB — BASIC METABOLIC PANEL
CO2: 24 mEq/L (ref 19–32)
Glucose, Bld: 111 mg/dL — ABNORMAL HIGH (ref 70–99)
Potassium: 4 mEq/L (ref 3.5–5.1)
Sodium: 131 mEq/L — ABNORMAL LOW (ref 135–145)

## 2010-06-24 LAB — POCT CARDIAC MARKERS: Myoglobin, poc: 92.9 ng/mL (ref 12–200)

## 2010-06-29 ENCOUNTER — Other Ambulatory Visit (HOSPITAL_COMMUNITY): Payer: Self-pay | Admitting: Internal Medicine

## 2010-07-10 LAB — CBC
HCT: 44.9 % (ref 39.0–52.0)
MCV: 92.2 fL (ref 78.0–100.0)
RBC: 4.87 MIL/uL (ref 4.22–5.81)
WBC: 9.3 10*3/uL (ref 4.0–10.5)

## 2010-07-10 LAB — COMPREHENSIVE METABOLIC PANEL
AST: 19 U/L (ref 0–37)
Alkaline Phosphatase: 70 U/L (ref 39–117)
CO2: 25 mEq/L (ref 19–32)
Chloride: 102 mEq/L (ref 96–112)
Creatinine, Ser: 0.8 mg/dL (ref 0.4–1.5)
GFR calc Af Amer: >60 mL/min (ref >60)
GFR calc non Af Amer: >60 mL/min (ref >60)
Potassium: 4 mEq/L (ref 3.5–5.1)
Total Bilirubin: 1 mg/dL (ref 0.3–1.2)

## 2010-07-10 LAB — POCT CARDIAC MARKERS
Troponin i, poc: <0.05 ng/mL (ref 0.00–0.09)
Troponin i, poc: <0.05 ng/mL (ref 0.00–0.09)

## 2010-07-10 LAB — DIFFERENTIAL
Basophils Absolute: 0 10*3/uL (ref 0.0–0.1)
Basophils Relative: 0 % (ref 0–1)
Eosinophils Absolute: 0.2 10*3/uL (ref 0.0–0.7)
Eosinophils Relative: 2 % (ref 0–5)
Lymphocytes Relative: 20 % (ref 12–46)

## 2010-08-03 ENCOUNTER — Other Ambulatory Visit (HOSPITAL_COMMUNITY): Payer: Self-pay | Admitting: Urology

## 2010-08-03 DIAGNOSIS — N400 Enlarged prostate without lower urinary tract symptoms: Secondary | ICD-10-CM

## 2010-08-03 DIAGNOSIS — R32 Unspecified urinary incontinence: Secondary | ICD-10-CM

## 2010-08-03 DIAGNOSIS — R109 Unspecified abdominal pain: Secondary | ICD-10-CM

## 2010-08-05 ENCOUNTER — Ambulatory Visit (HOSPITAL_COMMUNITY)
Admission: RE | Admit: 2010-08-05 | Discharge: 2010-08-05 | Disposition: A | Discharge status: Home / Self Care | Payer: Medicare Other | Source: Ambulatory Visit | Attending: Urology | Admitting: Urology

## 2010-08-05 ENCOUNTER — Encounter (HOSPITAL_COMMUNITY): Payer: Self-pay

## 2010-08-05 DIAGNOSIS — R32 Unspecified urinary incontinence: Secondary | ICD-10-CM

## 2010-08-05 DIAGNOSIS — N400 Enlarged prostate without lower urinary tract symptoms: Secondary | ICD-10-CM

## 2010-08-05 DIAGNOSIS — I10 Essential (primary) hypertension: Secondary | ICD-10-CM | POA: Insufficient documentation

## 2010-08-05 DIAGNOSIS — R109 Unspecified abdominal pain: Secondary | ICD-10-CM | POA: Insufficient documentation

## 2010-08-05 DIAGNOSIS — R350 Frequency of micturition: Secondary | ICD-10-CM | POA: Insufficient documentation

## 2010-08-05 DIAGNOSIS — R3129 Other microscopic hematuria: Secondary | ICD-10-CM | POA: Insufficient documentation

## 2010-08-05 HISTORY — DX: Essential (primary) hypertension: I10

## 2010-08-05 MED ORDER — IOHEXOL 300 MG/ML  SOLN
125.0000 mL | Freq: Once | INTRAMUSCULAR | Status: AC | PRN
  Administered 2010-08-05: 125 mL via INTRAVENOUS

## 2010-08-18 NOTE — Assessment & Plan Note (Signed)
OFFICE VISIT   Rodney Decker, Rodney Decker  DOB:  05/22/1940                                        September 15, 2009  CHART #:  [id number]   HISTORY:  The patient is a 70 year old white male status post coronary  artery bypass grafting x5 as well as coronary endarterectomy of the  right coronary artery on Aug 04, 2009, by Dr. Donata Clay for severe  multivessel coronary artery disease with unstable angina.  Postoperatively, he did have some difficulties with delirium.  He was  seen on today's date in the office and routine postoperative followup.  Currently, he reports that in general, he is feeling better but does  have both have good days and bad days.  This relates to multiple  issues as far as his energy level, his appetite, and his sense of  anxiety.  He denies any fevers, chills or other constitutional symptoms.  He denies anginal pain or equivalent.  He denies shortness of breath.  He does note some occasional clicking in his sternum, but this is  relatively rare.   Chest x-ray was obtained on today's date.  It shows some possible  migration of the fifth sternal wire.  There was no significant  effusions, infiltrates or findings consistent with congestive failure.   PHYSICAL EXAMINATION:  Vital Signs:  Blood pressure is 114/79, pulse 90,  respirations 18, oxygen saturation is 96% on room air.  General  Appearance:  Well-developed adult male in no acute distress.  Pulmonary:  Clear lungs bilaterally.  Cardiac:  Regular rate and rhythm.  Normal S1  and S2.  Incisions are inspected.  There is a mid sternal click with  cough.  There is no evidence of infection.  No drainage.  No erythema.  Extremities:  Trace bilateral lower extremity edema slightly more so on  the right side.   ASSESSMENT:  Mr. Baggerly is making adequate recovery following his  surgical revascularization.  He has been placed on Lasix by his primary  cardiologist.  There is some concern as it  relates to the sternal  incision due to the slight mobility with cough.  We have encouraged him  to continue significant sternal precautions with no lifting more than 5  pounds.  He can begin driving short distances.  We will see him again in the  office in 2 weeks with a chest x-ray.   Rowe Clack, P.A.-C.   Sherryll Burger  D:  09/15/2009  T:  09/16/2009  Job:  [id number]   cc:   Kerin Perna, M.D.  Sheliah Mends, MD

## 2010-08-18 NOTE — Consult Note (Signed)
NEW PATIENT CONSULTATION   Rodney Decker, Rodney Decker  DOB:  [birth year]                                        July 23, 2009  CHART #:  [id number]   CURRENT PROBLEMS:  1. Severe three-vessel coronary artery disease with moderate left main      stenosis.  2. Hypertension.  3. History of gastroesophageal reflux disease, hiatal hernia.  4. Chronic back pain, history of thoracic herniated disk.  5. Benign prostatic hypertrophy.  6. No known drug allergies.   PRESENT ILLNESS:  The patient is a 70 year old Caucasian male ex-smoker  who presents for surgical coronary revascularization.  He presented to  the hospital with unstable angina in January this year and underwent  cardiac catheterization by Dr. Garen Lah.  This demonstrated moderate  left main stenosis with severe three-vessel disease and preserved LV  function.  Surgical revascularization was recommended; however, he was  not able to proceed with surgery due to health care arrangements for  providing care to his 69 year old mother.  At this point, she is in a  nursing home and being taken care of and he can proceed with surgery and  he wishes to schedule this as soon as possible.  He is having some  intermittent angina now and takes nitroglycerin once or twice a week  with immediate relief of symptoms.  He is becoming more anxious and he  has been taking Ativan.  He has been having difficulty sleeping.  He has  some dyspnea on exertion and walks to the nursing home to visit his  mother using a walker.   At the time of his hospitalization in January, he had a moderate left  main stenosis with total occlusion of the LAD, high-grade stenosis of  the diagonal, moderate stenosis of a large OM, and a tight stenosis of a  smaller OM-1, and he had a large dominant right coronary with moderate  60% stenosis.  EF was normal and 2-Decker echo was performed which showed no  significant valvular abnormalities.  He did have his  pre-CABG Dopplers  which showed no carotid artery stenosis of significance.  The patient  does have a history of varicose veins which appeared to be mild and  equal in each lower extremity.   HOME MEDICATIONS:  1. Lisinopril 10 mg daily.  2. Ativan 1 p.o. b.i.Decker. p.r.n.  3. Pravastatin 30 mg at bedtime.  4. Metoprolol 25 mg b.i.Decker.  5. Aspirin 81 mg daily.  6. Oxycodone p.r.n. pain.   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  He does not smoke or use alcohol.  He lives  alone and still supports his mother during her current nursing home stay  following a fall and broken hip and hip replacement.   FAMILY HISTORY:  Positive for coronary disease in his family, negative  for diabetes.   REVIEW OF SYSTEMS:  CONSTITUTIONAL:  Negative for fever or weight loss.  ENT:  Significant for a loose dental implant in the left upper incisor.  Otherwise, he has veneered upper teeth.  THORAX:  Negative for history of trauma, hemoptysis, pneumothorax, or  rib fracture.  His last chest x-ray showed no active disease earlier  this year.  GI:  Positive for GERD.  No history of gallstones or blood per rectum.  UROLOGIC:  Positive for BPH.  VASCULAR:  Negative DVT, claudication, or TIA.  ENDOCRINE:  Negative for diabetes.  HEMATOLOGIC:  Negative for prior blood transfusion or bleeding disorder.   PHYSICAL EXAMINATION:  Vital Signs:  The patient is 5 feet 11 inches and  weighs 190 pounds.  Blood pressure is 110/76, pulse 70, respirations 18,  saturation 95% on room.  He is alert but anxious middle-aged Caucasian  male in no acute distress.  HEENT:  Normocephalic.  Pupils are equal.  Neck:  Without JVD, mass, or bruit.  Lymphatics:  No palpable adenopathy  of the neck or supraclavicular fossa.  Breath sounds are clear and  equal.  There is no thoracic deformity.  Cardiac:  Regular rhythm  without S3 gallop or murmur.  Abdomen:  Soft, nontender without   organomegaly or pulsatile mass.  Extremities:  Mild clubbing and no  cyanosis, edema, or tenderness.  Peripheral pulses are 2+ in all  extremities.  He has mild varicosities, mainly spider veins in his legs  from the knee distally.  Neurologic:  Alert and oriented without focal  motor deficit.   LABORATORY DATA:  I reviewed the coronary arteriograms performed by Dr.  Garen Lah in January which demonstrates severe multivessel coronary  disease with moderate left main stenosis, high-grade LAD diagonal  stenosis, high-grade circumflex disease, and moderate stenosis of the  right coronary circulation.  His systolic function is preserved.   The patient would benefit from surgical revascularization for relief of  symptoms, preservation of LV function, and improved survival.  This will  be scheduled for Monday, Aug 04, 2009, as the patient wishes to delay  surgery until his mother's birthday on July [birth year], 2009 (age 69).  I  discussed the details of surgery and the risks involved including the  risk of stroke, bleeding, blood transfusion requirement, MI, major  infection, and death.  He understands and agrees to proceed with surgery  as planned.   Kerin Perna, M.Decker.  Electronically Signed   PV/MEDQ  Decker:  07/23/2009  T:  07/24/2009  Job:  [id number]   cc:   Sheliah Mends, MD  Madelin Rear. Sherwood Gambler, MD

## 2010-08-18 NOTE — Assessment & Plan Note (Signed)
OFFICE VISIT   IVO, MOGA  DOB:  10-Sep-1940                                        October 08, 2009  CHART #:  [id number]   CURRENT PROBLEMS:  1. Status post coronary artery bypass graft x5, Aug 04, 2009.  2. Postoperative delirium, requiring restraints.  3. Mild sternal separation secondary to his delirium.  4. Chronic anxiety disorder.   PRESENT ILLNESS:  The patient returns for his sternal wound check after  multivessel bypass grafting almost 2 months ago.  On his followup x-  rays, his sternal wires are mildly displaced.  He has noted a sternal  click.  The incision itself is healed without evidence of infection.  It  appears he has a slight sternal separation from his extreme delirium and  abnormal stress on the incision in the perioperative period.  He denies  angina.  He is walking.  He is not smoking.  He still is having some  sternal discomfort.  On previous exams, any clicking or instability is  difficult to elicit on exam.  He is afebrile.   PHYSICAL EXAMINATION:  Vital Signs:  Blood pressure 151/80, pulse 78,  temperature afebrile, saturation 98%.  Lungs:  Breath sounds are clear.  Cardiac:  Rhythm is regular.  Chest:  The sternum is well healed.  I can  elicit no instability today.  Extremities:  There is no peripheral  edema.   DIAGNOSTIC TESTS:  His chest x-ray shows mild displacement of a lower  sternal wire, the others are aligned and this is no change from previous  x-rays.   IMPRESSION AND PLAN:  The patient has some mild sternal instability or  separation from his delirium postoperatively.  I feel this will  eventually a scarring and be very stable and not a problem.  He will  continue to do his light activities and drive and walk, but will avoid  lifting more than 20 pounds.  I have given him one more prescription for  oral oxycodone 5 mg, 30 tablets.  I will plan on seeing him back in 8  weeks for a chest x-ray and to  discuss his activity level at that time.   ADDENDUM  I asked the patient to resume his preoperative dose of metoprolol and  lisinopril since his blood pressure is now back in the high range.   Kerin Perna, M.D.  Electronically Signed   PV/MEDQ  D:  10/08/2009  T:  10/09/2009  Job:  [id number]   cc:   Kirk Ruths, M.D.  Sheliah Mends, MD

## 2010-08-18 NOTE — H&P (Signed)
NAMECOLBEY, Rodney Decker          ACCOUNT NO.:  [id number]   MEDICAL RECORD NO.:  [id number]          PATIENT TYPE:  AMB   LOCATION:  DAY                           FACILITY:  [facility]   PHYSICIAN:  R. Roetta Sessions, M.D. DATE OF BIRTH:  Oct 27, 1940   DATE OF ADMISSION:  DATE OF DISCHARGE:  LH                              HISTORY & PHYSICAL   CHIEF COMPLAINT:  Constipation.  Need for followup colonoscopy.   Rodney Decker is a pleasant, 70 year old gentleman with chronic low  back pain, on narcotic therapy.  He has a history of polyps.  Last  colonoscopy was in 2004.  He is due for a followup in five years, per  Dr. Inge Rise notes.  He came to see me with significant worsening in  constipation, having bowel movements sometimes no more than every three  to four days.  He has realized recently that taking his oxycodone has  been associated with marked diminution in bowel function and he has come  off the oxycodone for periods of time and his bowel function has  improved.  He has not passed any blood per rectum.  He has a history of  colonic polyps on colonoscopy in 2000 and 2003.  The path has been  reviewed and what I see in the chart reveals hyperplastic polyps only.  However, it was recommended by Dr. Dionicia Abler that he return in five years  for followup examination.  He has a history of multiple benign, stable  pulmonary nodules.  He has had serial followup imaging, orchestrated by  Dr. Dionicia Abler and done by [facility], and these lesions apparently have  been stable over time.  Rodney Decker denies reflux symptoms,  odynophagia, dysphagia, early satiety, nausea or vomiting.   PAST MEDICAL HISTORY:  Includes herniated thoracic disc disease, history  of dysuria, history of dysphagia, and gastroesophageal reflux disease.  Esophagogastroduodenoscopy, May 2004, demonstrated small sliding hiatal  hernia.  Esophagus was dilated with a #56 [place] dilator.   PAST SURGICAL HISTORY:   Includes cervical neck surgery at the [place].  He  had a lipoma removed, also appendectomy.   CURRENT MEDICATIONS:  Oxycodone one or two daily.   ALLERGIES:  No known drug allergies.   FAMILY HISTORY:  Father died, age 81, natural causes.  Mother is still  alive at age 74.  He had one brother who succumbed to car related  complications at age 68.   SOCIAL HISTORY:  Patient is single.  He works for the [employer].  He smokes one half to three quarters of a pack of cigarettes per day.  No alcohol or illicit drugs.   REVIEW OF SYSTEMS:  Has not had any chest pain, dyspnea on exertion, no  fever, chills, weight-loss.  Otherwise, as in History of Present  Illness.   PHYSICAL EXAMINATION:  Reveals a 70 year old gentleman, resting  comfortably.  Weight 185 pounds, height 5 feet 11, temperature 98.1, BP 140/84, pulse  52.  SKIN:  Warm and dry.  There is no jaundice.  HEENT EXAM:  No scleral icterus.  JVD is not prominent.  CHEST:  Lungs are clear  to auscultation.  CARDIAC EXAM:  Regular rate and rhythm without murmur, gallop or rub.  ABDOMEN:  Flat, positive bowel sounds, soft, nontender, without  appreciable mass or organomegaly.  RECTAL EXAM:  Deferred to time of colonoscopy.   IMPRESSION:  Rodney Decker is a pleasant, 70 year old gentleman  with change in bowel habits recently, temporally related to narcotic  therapy.  He has a history of colonic polyps.  He has had multiple  polyps removed from his colon in 2000, 2003.  As far as I can tell, they  were all adenomas.  Rodney Decker is noted to have concern about his  history of colonic polyps and his last colonoscopy was in 2003, when  multiple polyps were removed.  I do have color photographs on the chart  and it appears that these would have likely been more adenomas.  However, according to the biopsy report, these were adenomas.   RECOMMENDATIONS:  1. We will offer Rodney Decker a followup colonoscopy at this  time.      Potential risks, benefits, and alternatives have been reviewed.  2. He can try MiraLax 17 g early at bedtime p.r.n.  Constipation will      likely worsen whenever he is on oxycodone or any other narcotics,      for that matter.  3. We will make further recommendations, once colonoscopy has been      performed.      Rodney Decker, M.D.  Electronically Signed     RMR/MEDQ  D:  12/07/2006  T:  12/07/2006  Job:  [id number]   cc:   Kirk Ruths, M.D.  Fax: [phone]

## 2010-08-18 NOTE — Assessment & Plan Note (Signed)
OFFICE VISIT   Rodney Decker, Rodney Decker  DOB:  07/13/1940                                        December 03, 2009  CHART #:  [id number]   CURRENT PROBLEMS:  1. Status post coronary artery bypass graft x5 on Aug 04, 2009.  2. Mild sternal separation from postoperative delirium which precluded      proper sternal precautions.  3. Chronic anxiety disorder.   PRESENT ILLNESS:  The patient is now over 3 months following surgery.  He returns for a final office visit to assess his sternal instability.  On his last visit 6 weeks ago, it was improving.  A chest x-ray several  days postop showed displacement of one of the sternal wires and that has  not further changed over the ensuing several weeks.  The patient denies  any symptoms of sternal instability or unusual sternal pain or sternal  click.  He is undergoing outpatient hospital-based cardiac rehab and is  getting stronger.  He is still taking some Ultram for generalized  discomfort which he refers to as malaise.  He denies angina.  He remains  on aspirin, Plavix, and simvastatin.   PHYSICAL EXAMINATION:  Blood pressure 128/80, pulse 60 and regular,  respirations 18, saturation 96%, weight 188 pounds.  He is alert and  pleasant.  Breath sounds are clear.  Cardiac rhythm is regular.  There  is no murmur or gallop.  The sternum is stable and I enlisted no  instability at all.   DIAGNOSTIC TESTS:  A PA and lateral chest x-ray today still shows slight  displacement of one of the lower sternal wires, otherwise there has been  no evidence of sternal wire migration and the wire that is displaced has  been stable in apposition for several weeks.  There is no pleural  effusion.   IMPRESSION AND PLAN:  The patient was released to normal activity  levels.  I provided him one final prescription for tramadol for pain.  He knows he should never smoke again, and he was told he could safely  have colonoscopy after April 05, 2010.   Kerin Perna, M.D.  Electronically Signed   PV/MEDQ  D:  12/03/2009  T:  12/04/2009  Job:  [id number]   cc:   Sheliah Mends, MD  Madelin Rear. Sherwood Gambler, MD

## 2010-08-21 NOTE — H&P (Signed)
NAME:  Rodney Decker, Rodney Decker                    ACCOUNT NO.:  [id number]   MEDICAL RECORD NO.:  [id number]                   PATIENT TYPE:  OUT   LOCATION:  RAD                                  FACILITY:  [facility]   PHYSICIAN:  Tommye Standard, M.D.                 DATE OF BIRTH:  [birth year]   DATE OF ADMISSION:  08/16/2002  DATE OF DISCHARGE:                                HISTORY & PHYSICAL   CHIEF COMPLAINT:  Acid reflux, pressure in esophagus, abdominal pain.   HISTORY OF PRESENT ILLNESS:  Rodney Decker is a 70 year old Caucasian male  who presents today for further evaluation of the above-stated symptoms.  He  states over the last few months he has had epigastric pain, pressure in his  esophagus, and occasional typical heartburn symptoms.  He was placed on  Nexium and is currently taking it 40 mg q.o.d.  He said he had little  improvement of his symptoms.  He admits to having bad eating habits.  He  also wakes up in the middle of the night and will get something to eat.  He  denies any true dysphagia.  Food seems to be going down, but he feels a  sensation of choking or pressure in his esophagus.  He is concerned a lipoma  from his right chest wall, which was removed 13 years ago, may be coming  back and causing these symptoms.  He is also very anxious that he may have  lung cancer, given that he is a chronic smoker.   CURRENT MEDICATIONS:  1. Nexium 40 mg p.o. q.o.d.  2. Darvocet-N 100 1 tablet approximately twice per week.   ALLERGIES:  No known drug allergies.   PAST MEDICAL HISTORY:  1. Arthritis.  2. Status post left inguinal hernia repair 8 years ago.  3. Removal of right upper chest lipoma 13 years ago.  4. The patient has had colonoscopy in July 2003 with multiple hyperplastic     polyps previously.  He is due for a repeat colonoscopy in 5 years.   FAMILY HISTORY:  Grandmother died of bone cancer.  He reports a non-first-  degree relative had stomach cancer.  No  family history of colorectal cancer.   SOCIAL HISTORY:  He is single.  He has no children.  He works for the post  office.  He smokes 10 cigarettes daily.  Denies any alcohol use.   REVIEW OF SYSTEMS:  Please see HPI for GI.  GENERAL:  Denies any weight  loss.  CARDIOPULMONARY:  Complains of cough, but no shortness of breath.   PHYSICAL EXAMINATION:  VITAL SIGNS:  Weight 180, blood pressure 104/74,  pulse 70.  GENERAL:  A pleasant, well-developed, well-nourished, Caucasian male in no  acute distress.  He appears somewhat anxious.  SKIN:  Warm and dry.  No jaundice.  HEENT:  Conjunctivae is pink.  Sclerae nonicteric.  Oropharyngeal mucosa  moist and pink.  No lesions, erythema, or exudate.  No lymphadenopathy,  thyromegaly.  CHEST:  Lungs are clear to auscultation.  CARDIOVASCULAR:  Regular rate and rhythm, normal S1, S2.  No murmurs, rubs,  or gallops.  ABDOMEN:  Positive bowel sounds, soft, nondistended.  He has mild epigastric  tenderness to deep palpation.  No organomegaly or masses.  EXTREMITIES:  No edema.   IMPRESSION:  1. Occasional typical acid reflux symptoms as well as sensation of pressure     in the esophagus.  He does not really describe typical dysphagia-type     symptoms.  Given that these are new symptoms, I feel he needs to have     further evaluation.  2. Epigastric pain.  May be due to poorly-controlled reflux.  He is not on     any nonsteroidal anti-inflammatory drugs or aspirin use.  3. Tobacco use with chronic cough.  The patient is concerned about the     possibility of lung cancer and would like a chest x-ray.   PLAN:  1. EGD in the near future.  2. Continue Nexium, though will increase to 40 mg p.o. daily.  3. Chest x-ray PA and lateral.       Tana Coast, P.A.                        Tommye Standard, M.D.    LL/MEDQ  D:  08/16/2002  T:  08/16/2002  Job:  [id number]   cc:   Bernerd Limbo. Leona Carry, M.D.  [address]  [place]  [place] [address]  Fax:  [phone]

## 2010-08-21 NOTE — Procedures (Signed)
   NAME:  LAKEITH, CAREAGA                       ACCOUNT NO.:  [id number]   MEDICAL RECORD NO.:  [id number]                   PATIENT TYPE:  OUT   LOCATION:  RAD                                  FACILITY:  [facility]   PHYSICIAN:  Darlin Priestly, M.D.             DATE OF BIRTH:  September 19, 1940   DATE OF PROCEDURE:  02/15/2003  DATE OF DISCHARGE:                                  ECHOCARDIOGRAM   CLINICAL HISTORY:  Mr. Prins is a 70 year old male patient of Dr. Corrie Mckusick and Dr. Kem Boroughs with a history of abdominal swelling and  nausea.  He is now referred for a 2-D echocardiogram to evaluate LV function  and valvular structures.   MEASUREMENTS:  1. The aorta is within normal limits at 3.9 cm.  2. The left atrium is within normal limits at 3.3 cm.   FINDINGS:  1. There are no clots seen.  2. The patient was in sinus rhythm during the procedure.  3. The intraventricular septum and _________ are upper limits of normal at     1.2 and 1.1 cm respectively.  4. The aortic valve appears to be mildly thickened with no evidence of     significant aortic stenosis or regurgitation.  5. The mitral valve appears to be structurally normal with trivial mitral     regurgitation.  6. The tricuspid valve appears to be structurally normal with trivial     tricuspid regurgitation.  7. The left ventricular internal dimensions are within normal limits at 4.2     and 3.1 cm respectively.  There appears to be good overall left     ventricular function estimated at 60% with no segmental wall motion     abnormalities visualized.  8. Normal right ventricular size and systolic function.   CONCLUSION:  1. Normal left ventricular size and systolic function estimated at 60%.  2. Mildly-thickened aortic valve with no significant area of stenosis or     regurgitation.  3.     Structurally-normal mitral valve with trivial mitral regurgitation.  4. Structurally-normal tricuspid valve with trivial  tricuspid regurgitation.  5. Normal right ventricular size and systolic function.      ___________________________________________                                            Darlin Priestly, M.D.   RHM/MEDQ  D:  02/15/2003  T:  02/15/2003  Job:  [id number]   cc:   Corrie Mckusick, M.D.  [address] [place]. [place]  [place]  [place] [address]  Fax: [phone]   T Bracher, Dr.

## 2010-08-21 NOTE — Op Note (Signed)
NAME:  Rodney Decker, Rodney Decker                    ACCOUNT NO.:  [id number]   MEDICAL RECORD NO.:  [id number]                   PATIENT TYPE:  AMB   LOCATION:  DAY                                  FACILITY:  [facility]   PHYSICIAN:  Lionel December, M.D.                 DATE OF BIRTH:  04-25-1940   DATE OF PROCEDURE:  08/22/2002  DATE OF DISCHARGE:                                 OPERATIVE REPORT   PROCEDURE:  Esophagogastroduodenoscopy with esophageal dilatation.   INDICATIONS:  The patient is a 70 year old Caucasian male with symptoms of  GERD, who also has dysphagia.  He also complains of hearing a click in his  throat every time he swallows he has noted over the last couple of months.  This is not associated with pain.  He is concerned that this somehow may be  related to a lipoma that he had removed from the right side of his neck.  He  is undergoing EGD both for diagnostic and therapeutic purposes.  The  procedure was reviewed with the patient and informed consent was obtained.   PREMEDICATION:  Cetacaine spray for pharyngeal topical anesthesia, Demerol  25 mg IV, Versed 7 mg IV in divided dose.   INSTRUMENT USED:  Olympus video system.   FINDINGS:  Procedure performed in endoscopy suite.  The patient's vital  signs and O2 saturation were monitored during the procedure and remained  stable.  The patient was placed in the left lateral recumbent position, the  endoscope was passed without any difficulty into the oropharynx and  esophagus.  On the way in, the laryngeal inlet was examined and appeared to  be normal.   Esophagus:  Mucosa of the esophagus was normal throughout.  Squamocolumnar  junction was unremarkable.  There was no ring or stricture.  There was a  small sliding hiatal hernia.  A picture was taken for the record.   Stomach:  It was empty and distended very well with insufflation.  Folds in  proximal stomach were normal.  Examination of the mucosa of the body,  antrum,  pyloric channel, as well as angularis, fundus, and cardia was  normal.   Duodenum:  Examination of the bulb and second part of the duodenum was  normal.  Endoscope was withdrawn and esophagus was dilated by passing 56  [place] Maloney dilator through the esophagus completely.  As the dilator was  withdrawn, endoscope was passed again and there was no mucosal destruction  noted to the esophagus.  Endoscope withdrawn.  The patient tolerated the  procedure well.   FINAL DIAGNOSES:  1. Small sliding hiatal hernia, otherwise normal examination.  2. Esophagus dilated by passing 56 French Maloney dilator given history of     dysphagia.   RECOMMENDATIONS:  1. Antireflux measures reinforced.  I would like for him to take Nexium 40     mg q.a.m. for at least the next two months.  2. He  will return for chest CT to further evaluate abnormality seen on chest     film.  I reviewed this film earlier today with Dr. Tyron Russell, who felt that     this was not a nipple shadow and felt a study with markers may not be     helpful.  We therefore will proceed with chest CT.                                               Lionel December, M.D.    NR/MEDQ  D:  08/22/2002  T:  08/23/2002  Job:  [id number]   cc:   Angus G. Renard Matter, M.D.  [address]  [place]  [place] [address]  Fax: [phone]

## 2011-02-08 ENCOUNTER — Other Ambulatory Visit (HOSPITAL_COMMUNITY): Payer: Self-pay | Admitting: Internal Medicine

## 2011-02-08 DIAGNOSIS — R222 Localized swelling, mass and lump, trunk: Secondary | ICD-10-CM

## 2011-02-10 ENCOUNTER — Ambulatory Visit (HOSPITAL_COMMUNITY): Payer: Medicare Other

## 2011-02-11 ENCOUNTER — Telehealth (INDEPENDENT_AMBULATORY_CARE_PROVIDER_SITE_OTHER): Payer: Self-pay | Admitting: Internal Medicine

## 2011-02-11 ENCOUNTER — Ambulatory Visit (INDEPENDENT_AMBULATORY_CARE_PROVIDER_SITE_OTHER): Payer: Medicare Other | Admitting: Internal Medicine

## 2011-02-11 ENCOUNTER — Other Ambulatory Visit (HOSPITAL_COMMUNITY): Payer: Medicare Other

## 2011-02-11 ENCOUNTER — Encounter (INDEPENDENT_AMBULATORY_CARE_PROVIDER_SITE_OTHER): Payer: Self-pay | Admitting: Internal Medicine

## 2011-02-11 VITALS — BP 120/62 | HR 66 | Temp 97.8°F | Ht 71.0 in | Wt 196.6 lb

## 2011-02-11 DIAGNOSIS — Z860100 Personal history of colon polyps, unspecified: Secondary | ICD-10-CM

## 2011-02-11 DIAGNOSIS — K219 Gastro-esophageal reflux disease without esophagitis: Secondary | ICD-10-CM

## 2011-02-11 LAB — CBC WITH DIFFERENTIAL/PLATELET
HCT: 45.4 % (ref 39.0–52.0)
Hemoglobin: 15 g/dL (ref 13.0–17.0)
Lymphocytes Relative: 27 % (ref 12–46)
Monocytes Absolute: 0.7 10*3/uL (ref 0.1–1.0)
Monocytes Relative: 10 % (ref 3–12)
Neutro Abs: 4.7 10*3/uL (ref 1.7–7.7)
Neutrophils Relative %: 61 % (ref 43–77)
RBC: 4.89 MIL/uL (ref 4.22–5.81)
WBC: 7.8 10*3/uL (ref 4.0–10.5)

## 2011-02-11 NOTE — Patient Instructions (Signed)
Will schedule a colonoscopy with Dr. Karilyn Cota for colonic polyps. Will get an H. Pylori and a CBC today. He is to take his PPI on a daily basis. He will call with a progress report in 2 weeks. If not better will schedule and EGD with Dr. Karilyn Cota.

## 2011-02-11 NOTE — Progress Notes (Signed)
Subjective:     Patient ID: Rodney Decker, male   DOB: May 13, 1940, 70 y.o.   MRN: [id number]  HPI Rodney Decker is a 70 yr old male here for a scheduled visit.  He c/o gurgling sound in his chest. He also c/o pain in his upper abdomen. He says he has a poor appetite. He says he has not had an appetite since his CABG. If he eats meats, his stomach will make noise.  he He is having a BM about one every other day or every day. Stool light brown to dark brown.  Acid reflux occasionally about once a week.  Nexium on a prn basis.  No weight loss.  No dysphagia. No melena or bright red rectal bleeding.  His last colonoscopy was in 2003 for surveillance of colonic polyps. Biopsy revealed hyperplastic polyps. Recommended colonoscopy in 3 yrs.  08/27/2002 EGD/ED FINAL DIAGNOSES:  1. Small sliding hiatal hernia, otherwise normal examination.  2. Esophagus dilated by passing 56 French Maloney dilator given history of  Dysphagia.   Review of Systems see hpi Current Outpatient Prescriptions  Medication Sig Dispense Refill  . clopidogrel (PLAVIX) 75 MG tablet Take 75 mg by mouth daily.        Marland Kitchen lisinopril (PRINIVIL,ZESTRIL) 5 MG tablet Take 5 mg by mouth daily.        . rosuvastatin (CRESTOR) 10 MG tablet Take by mouth daily.         History reviewed. No pertinent past surgical history. Past Medical History  Diagnosis Date  . Hypertension   . CAD (coronary artery disease)   . Insomnia   . Depression    History   Social History Narrative  . No narrative on file   Family Status  Relation Status Death Age  . Mother Alive   . Father Deceased     CHF  . Brother Deceased     etoh abuse   History   Social History  . Marital Status: Single    Spouse Name: N/A    Number of Children: N/A  . Years of Education: N/A   Occupational History  . Not on file.   Social History Main Topics  . Smoking status: Former Games developer  . Smokeless tobacco: Not on file  . Alcohol Use: No  . Drug Use: No  .  Sexually Active: Not on file   Other Topics Concern  . Not on file   Social History Narrative  . No narrative on file   No Known Allergies      Objective:   Physical Exam Filed Vitals:   02/11/11 1118  Height: 5\' 11"  (1.803 m)  Weight: 196 lb 9.6 oz (89.177 kg)    Alert and oriented. Skin warm and dry. Oral mucosa is moist. Natural teeth in good condition. Sclera anicteric, conjunctivae is pink. Thyroid not enlarged. No cervical lymphadenopathy. Lungs clear. Heart regular rate and rhythm.  Abdomen is soft. Bowel sounds are positive. No hepatomegaly. No abdominal masses felt. No tenderness.  No edema to lower extremities. Patient is alert and oriented.     Assessment:    GERD. Which is not controlled. PUD needs to be ruled out. Hx of multiple colonic polyps. Hyperplastic. Last colonoscopy was in 2003. (Recommended colonoscopy in 3 yrs)    Plan:    Nexium on a daily basis 30 minutes before breakfast. H. Pylori, CBC. Further recommendations once we have labs back.  Progress report in 2 weeks.  If not better will schedule an EGD.  Will schedule a colonoscopy with Dr. Karilyn Cota.

## 2011-02-12 ENCOUNTER — Ambulatory Visit (HOSPITAL_COMMUNITY)
Admission: RE | Admit: 2011-02-12 | Discharge: 2011-02-12 | Disposition: A | Discharge status: Home / Self Care | Payer: Medicare Other | Source: Ambulatory Visit | Attending: Internal Medicine | Admitting: Internal Medicine

## 2011-02-12 DIAGNOSIS — R222 Localized swelling, mass and lump, trunk: Secondary | ICD-10-CM

## 2011-02-12 DIAGNOSIS — J984 Other disorders of lung: Secondary | ICD-10-CM | POA: Insufficient documentation

## 2011-02-12 LAB — H. PYLORI ANTIBODY, IGG: H Pylori IgG: 0.69 {ISR}

## 2011-02-12 NOTE — Telephone Encounter (Signed)
Talked w/ patient, advised him Rodney Decker wants him to have TCS since he is past due, patient requested appt w/ Dr Karilyn Cota so he could discuss this with him, appt 12/10

## 2011-03-15 ENCOUNTER — Ambulatory Visit (INDEPENDENT_AMBULATORY_CARE_PROVIDER_SITE_OTHER): Payer: Medicare Other | Admitting: Internal Medicine

## 2011-03-15 ENCOUNTER — Encounter (INDEPENDENT_AMBULATORY_CARE_PROVIDER_SITE_OTHER): Payer: Self-pay | Admitting: *Deleted

## 2011-03-15 ENCOUNTER — Other Ambulatory Visit (INDEPENDENT_AMBULATORY_CARE_PROVIDER_SITE_OTHER): Payer: Self-pay | Admitting: *Deleted

## 2011-03-15 ENCOUNTER — Encounter (INDEPENDENT_AMBULATORY_CARE_PROVIDER_SITE_OTHER): Payer: Self-pay | Admitting: Internal Medicine

## 2011-03-15 VITALS — BP 120/70 | HR 74 | Temp 97.7°F | Resp 14 | Ht 71.0 in | Wt 203.0 lb

## 2011-03-15 DIAGNOSIS — R1013 Epigastric pain: Secondary | ICD-10-CM

## 2011-03-15 DIAGNOSIS — Z860100 Personal history of colon polyps, unspecified: Secondary | ICD-10-CM

## 2011-03-15 DIAGNOSIS — K5900 Constipation, unspecified: Secondary | ICD-10-CM

## 2011-03-15 NOTE — H&P (Signed)
Presenting complaint;  Epigastric pain, abdominal gurgling and constipation.  History of colonic polyps. Patient interested in colonoscopy.  History of present illness;  Patient is 70-year-old Caucasian male patient of Dr. Fosco is was here for scheduled visit. He has multiple complaints.  He was seen in our office on 02/11/2011 by Ms. Terri Setzer NP and advised to take Nexium daily he is still taking it 3 times a week. He has heartburn with certain foods. He is having this discomfort every day it started about 6 months ago. He has occasional regurgitation but he denies nausea vomiting or dysphagia. He also describes his pain as if "something is closing" in his epigastric region. Despite this pain he has a good appetite and has not lost any weight. If anything she has gained 10 pounds in the last 18 months. He is trying to lose weight and he states he has cut back on Mountain Dew and still has craving.  He complains of constipation. He is using fleets enemas once or twice a week. He denies melena or rectal bleeding.  He is also worried about his enlarged prostate. He has nocturia 2-3 times every night. He has seen Dr. Javaid and had cystoscopy. His PSA level has been normal.  He denies chest pain or shortness of breath.  Current Medications:  Current Outpatient Prescriptions on File Prior to Visit   Medication  Sig  Dispense  Refill   .  clopidogrel (PLAVIX) 75 MG tablet  Take 75 mg by mouth daily.     .  lisinopril (PRINIVIL,ZESTRIL) 5 MG tablet  Take 5 mg by mouth daily.     .  rosuvastatin (CRESTOR) 10 MG tablet  Take 5 mg by mouth daily.     past medical history;  History of colonic polyps. His first exam was in 1998 and second exam was in 2003. He had adenomas on his first exam and hypoplastic polyp on his exam in 2003.  Chronic GERD. His EGD was in May 2004 revealing small sliding had hernia. His esophagus at that time was dilated with 56 French Maloney dilator.  History of lung nodule  stable on sequential CT.  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  Hypertension  Chronic low back pain.  BPH.  Allergies;  NKA  Family history;  Father died of CHF at age 86.  Mother is 99 and in a nursing home.  One brother died at age 31 he had alcoholic liver disease and diverticulitis.  Social history;  He is single.  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  He smoked about a pack a day for 40 years but quit 6 years ago.  He does not drink alcohol.  Objective:  BP 120/70  Pulse 74  Temp(Src) 97.7 F (36.5 C) (Oral)  Resp 14  Ht 5' 11" (1.803 m)  Wt 203 lb (92.08 kg)  BMI 28.31 kg/m2  Conjunctiva is pink. Sclera is nonicteric  Oral pharyngeal mucosa is normal.  No neck masses or thyromegaly noted.  Cardiac exam with regular rhythm normal S1 and S2. No murmur or gallop noted.  Lungs are clear to auscultation.  Abdomen is is full . Bowel sounds are normal no bruit noted .  Soft abdomen with mild tenderness at epigastric region and LLQ. No organomegaly or masses.  Rectal examination reveals non-nodular prostate.  Stool is guaiac-negative  No LE edema or clubbing noted.  Assessment:  #1. Intermittent vague epigastric pain without alarm symptoms. No   risk factors for peptic ulcer disease. His last ultrasound was in April 2011 revealing sludge but no evidence of cholelithiasis or dilated bile ducts. If he does not respond to daily use of PPI will consider repeating an ultrasound.  #2. History of colonic polyps. He is long overdue her surveillance colonoscopy.  #3. Chronic constipation.  Plan:  Patient advised to take Nexium 40 mg by mouth 30 minutes before breakfast daily.  Continue high-fiber diet.  GlycoLax half to one scoop daily.  Surveillance colonoscopy to be scheduled in near future.  He will continue aspirin but stop Plavix for 5 days prior to the procedure.     

## 2011-03-15 NOTE — Patient Instructions (Signed)
Take GlycoLax half to one scoop daily or every other day rather than on an as-needed basis. Take Nexium 40 mg by mouth 30 minutes before breakfast daily for the next weeks and call with progress report. Colonoscopy to be scheduled

## 2011-03-16 ENCOUNTER — Encounter (HOSPITAL_COMMUNITY): Payer: Self-pay | Admitting: Pharmacy Technician

## 2011-03-22 NOTE — Progress Notes (Signed)
Presenting complaint;  Epigastric pain, abdominal gurgling and constipation.  History of colonic polyps. Patient interested in colonoscopy.  History of present illness;  Patient is 70 year old Caucasian male patient of Dr. Carlena Sax is was here for scheduled visit. He has multiple complaints.  He was seen in our office on 02/11/2011 by Ms. Dorene Ar NP and advised to take Nexium daily he is still taking it 3 times a week. He has heartburn with certain foods. He is having this discomfort every day it started about 6 months ago. He has occasional regurgitation but he denies nausea vomiting or dysphagia. He also describes his pain as if "something is closing" in his epigastric region. Despite this pain he has a good appetite and has not lost any weight. If anything she has gained 10 pounds in the last 18 months. He is trying to lose weight and he states he has cut back on [employer] and still has craving.  He complains of constipation. He is using fleets enemas once or twice a week. He denies melena or rectal bleeding.  He is also worried about his enlarged prostate. He has nocturia 2-3 times every night. He has seen Dr. Jerre Simon and had cystoscopy. His PSA level has been normal.  He denies chest pain or shortness of breath.  Current Medications:  Current Outpatient Prescriptions on File Prior to Visit   Medication  Sig  Dispense  Refill   .  clopidogrel (PLAVIX) 75 MG tablet  Take 75 mg by mouth daily.     Marland Kitchen  lisinopril (PRINIVIL,ZESTRIL) 5 MG tablet  Take 5 mg by mouth daily.     .  rosuvastatin (CRESTOR) 10 MG tablet  Take 5 mg by mouth daily.     past medical history;  History of colonic polyps. His first exam was in 1998 and second exam was in 2003. He had adenomas on his first exam and hypoplastic polyp on his exam in 2003.  Chronic GERD. His EGD was in May 2004 revealing small sliding had hernia. His esophagus at that time was dilated with 56 [place] Maloney dilator.  History of lung nodule  stable on sequential CT.  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  Hypertension  Chronic low back pain.  BPH.  Allergies;  NKA  Family history;  Father died of CHF at age 70.  Mother is 108 and in a nursing home.  One brother died at age 7 he had alcoholic liver disease and diverticulitis.  Social history;  He is single.  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  He smoked about a pack a day for 40 years but quit 6 years ago.  He does not drink alcohol.  Objective:  BP 120/70  Pulse 74  Temp(Src) 97.7 F (36.5 C) (Oral)  Resp 14  Ht 5\' 11"  (1.803 m)  Wt 203 lb (92.08 kg)  BMI 28.31 kg/m2  Conjunctiva is pink. Sclera is nonicteric  Oral pharyngeal mucosa is normal.  No neck masses or thyromegaly noted.  Cardiac exam with regular rhythm normal S1 and S2. No murmur or gallop noted.  Lungs are clear to auscultation.  Abdomen is is full . Bowel sounds are normal no bruit noted .  Soft abdomen with mild tenderness at epigastric region and LLQ. No organomegaly or masses.  Rectal examination reveals non-nodular prostate.  Stool is guaiac-negative  No LE edema or clubbing noted.  Assessment:  #1. Intermittent vague epigastric pain without alarm symptoms. No  risk factors for peptic ulcer disease. His last ultrasound was in April 2011 revealing sludge but no evidence of cholelithiasis or dilated bile ducts. If he does not respond to daily use of PPI will consider repeating an ultrasound.  #2. History of colonic polyps. He is long overdue her surveillance colonoscopy.  #3. Chronic constipation.  Plan:  Patient advised to take Nexium 40 mg by mouth 30 minutes before breakfast daily.  Continue high-fiber diet.  GlycoLax half to one scoop daily.  Surveillance colonoscopy to be scheduled in near future.  He will continue aspirin but stop Plavix for 5 days prior to the procedure.

## 2011-03-24 ENCOUNTER — Other Ambulatory Visit (INDEPENDENT_AMBULATORY_CARE_PROVIDER_SITE_OTHER): Payer: Self-pay | Admitting: Internal Medicine

## 2011-03-24 ENCOUNTER — Encounter (HOSPITAL_COMMUNITY)
Admission: RE | Disposition: A | Discharge status: Home / Self Care | Payer: Self-pay | Source: Ambulatory Visit | Attending: Internal Medicine

## 2011-03-24 ENCOUNTER — Encounter (HOSPITAL_COMMUNITY): Payer: Self-pay | Admitting: *Deleted

## 2011-03-24 ENCOUNTER — Ambulatory Visit (HOSPITAL_COMMUNITY)
Admission: RE | Admit: 2011-03-24 | Discharge: 2011-03-24 | Disposition: A | Discharge status: Home / Self Care | Payer: Medicare Other | Source: Ambulatory Visit | Attending: Internal Medicine | Admitting: Internal Medicine

## 2011-03-24 DIAGNOSIS — Z860100 Personal history of colon polyps, unspecified: Secondary | ICD-10-CM | POA: Insufficient documentation

## 2011-03-24 DIAGNOSIS — D129 Benign neoplasm of anus and anal canal: Secondary | ICD-10-CM

## 2011-03-24 DIAGNOSIS — R1013 Epigastric pain: Secondary | ICD-10-CM

## 2011-03-24 DIAGNOSIS — D128 Benign neoplasm of rectum: Secondary | ICD-10-CM

## 2011-03-24 DIAGNOSIS — I10 Essential (primary) hypertension: Secondary | ICD-10-CM | POA: Insufficient documentation

## 2011-03-24 DIAGNOSIS — Z79899 Other long term (current) drug therapy: Secondary | ICD-10-CM | POA: Insufficient documentation

## 2011-03-24 DIAGNOSIS — D126 Benign neoplasm of colon, unspecified: Secondary | ICD-10-CM

## 2011-03-24 DIAGNOSIS — K5909 Other constipation: Secondary | ICD-10-CM | POA: Insufficient documentation

## 2011-03-24 DIAGNOSIS — K644 Residual hemorrhoidal skin tags: Secondary | ICD-10-CM | POA: Insufficient documentation

## 2011-03-24 HISTORY — PX: COLONOSCOPY: SHX5424

## 2011-03-24 HISTORY — DX: Gastro-esophageal reflux disease without esophagitis: K21.9

## 2011-03-24 SURGERY — COLONOSCOPY
Anesthesia: Moderate Sedation

## 2011-03-24 MED ORDER — SODIUM CHLORIDE 0.9 % IJ SOLN
INTRAMUSCULAR | Status: DC | PRN
  Administered 2011-03-24: 10 mL

## 2011-03-24 MED ORDER — MIDAZOLAM HCL 5 MG/5ML IJ SOLN
INTRAMUSCULAR | Status: DC | PRN
  Administered 2011-03-24 (×7): 2 mg via INTRAVENOUS

## 2011-03-24 MED ORDER — MIDAZOLAM HCL 5 MG/5ML IJ SOLN
INTRAMUSCULAR | Status: AC
  Filled 2011-03-24: qty 5

## 2011-03-24 MED ORDER — MIDAZOLAM HCL 5 MG/5ML IJ SOLN
INTRAMUSCULAR | Status: AC
  Filled 2011-03-24: qty 10

## 2011-03-24 MED ORDER — MEPERIDINE HCL 50 MG/ML IJ SOLN
INTRAMUSCULAR | Status: DC | PRN
  Administered 2011-03-24 (×2): 25 mg via INTRAVENOUS

## 2011-03-24 MED ORDER — STERILE WATER FOR IRRIGATION IR SOLN
Status: DC | PRN
  Administered 2011-03-24: 15:00:00

## 2011-03-24 MED ORDER — MEPERIDINE HCL 50 MG/ML IJ SOLN
INTRAMUSCULAR | Status: AC
  Filled 2011-03-24: qty 1

## 2011-03-24 MED ORDER — SODIUM CHLORIDE 0.45 % IV SOLN
Freq: Once | INTRAVENOUS | Status: AC
  Administered 2011-03-24: 15:00:00 via INTRAVENOUS

## 2011-03-24 NOTE — Progress Notes (Signed)
Dr Karilyn Cota in to see pt.

## 2011-03-24 NOTE — Op Note (Signed)
COLONOSCOPY PROCEDURE REPORT  PATIENT:  Rodney Decker  MR#:  [id number] Birthdate:  May 08, 1940, 70 y.o., male Endoscopist:  Dr. Malissa Hippo, MD Referred By:  Dr. Madelin Rear. Sherwood Gambler, MD Procedure Date: 03/24/2011  Procedure:   Colonoscopy with snare polypectomy and Hemoclip application to polypectomy site at cecum  Indications: Patient is 70 year old Caucasian male with history of colonic polyps was long overdue for exam. He has noted some constipation recently.  Informed Consent:  Procedure and risks were reviewed with the patient and informed consent was obtained.  Medications:  Demerol 50 mg IV Versed 14 mg IV  Description of procedure:  After a digital rectal exam was performed, that colonoscope was advanced from the anus through the rectum and colon to the area of the cecum, ileocecal valve and appendiceal orifice. The cecum was deeply intubated. These structures were well-seen and photographed for the record. From the level of the cecum and ileocecal valve, the scope was slowly and cautiously withdrawn. The mucosal surfaces were carefully surveyed utilizing scope tip to flexion to facilitate fold flattening as needed. The scope was pulled down into the rectum where a thorough exam including retroflexion was performed.  Findings:   Prep satisfactory. 3 small cecal polyps ablated via cold biopsy and submitted in one container. 12 mm broad-based polyp behind ileocecal valve snared after elevating with saline injection. This polyp was retrieved with a Lucina Mellow net. Oozing from polypectomy site easily controlled with application of 2 hemoclips.. 2 polyps snared from ascending colon but 7-8 mm.. 6- 7 mm polyp snared from descending colon. 10 mm polyp snared from proximal sigmoid colon. 2 small polyps snared from sigmoid colon and submitted in one container. 8mm rectal polyp snared. It was located just above the dentate line. Patient still has few polyps left to be removed. Small  hemorrhoids below the dentate line.   Therapeutic/Diagnostic Maneuvers Performed:  See above  Complications:  No hemoclips applied to polypectomy site at cecum to control slow ooze.  Cecal Withdrawal Time:  53 minutes  Impression:  Examination performed to cecum. 11 polyps removed; 3 were biopsied and 8 were snared 3 were ablated via cold biopsy from cecum; these were small. Largest polyp was at cecum about 12 mm. 2 hemoclips applied to polypectomy site to control oozing. Of the polyps could not be removed on today's exam.  Recommendations:  Standard instructions given. Resume Plavix on day 5. I will be contacting patient with results of biopsy and further recommendations.  Rodney Decker  03/24/2011 4:40 PM  CC: Dr. Cassell Smiles., MD, MD & Dr. Bonnetta Barry ref. provider found

## 2011-03-24 NOTE — H&P (Signed)
This is an update to history and physical from 03/15/2011. Is been no change in patient's medications or symptoms. He is undergoing surveillance colonoscopy.

## 2011-03-24 NOTE — Progress Notes (Signed)
Waiting for Dr Karilyn Cota to see pt.

## 2011-04-02 ENCOUNTER — Encounter (INDEPENDENT_AMBULATORY_CARE_PROVIDER_SITE_OTHER): Payer: Self-pay | Admitting: *Deleted

## 2011-04-08 ENCOUNTER — Encounter (HOSPITAL_COMMUNITY): Payer: Self-pay | Admitting: Internal Medicine

## 2011-05-28 DIAGNOSIS — G8929 Other chronic pain: Secondary | ICD-10-CM | POA: Diagnosis not present

## 2011-05-28 DIAGNOSIS — Z6828 Body mass index (BMI) 28.0-28.9, adult: Secondary | ICD-10-CM | POA: Diagnosis not present

## 2011-05-28 DIAGNOSIS — R319 Hematuria, unspecified: Secondary | ICD-10-CM | POA: Diagnosis not present

## 2011-05-28 DIAGNOSIS — I2510 Atherosclerotic heart disease of native coronary artery without angina pectoris: Secondary | ICD-10-CM | POA: Diagnosis not present

## 2011-05-28 DIAGNOSIS — K5900 Constipation, unspecified: Secondary | ICD-10-CM | POA: Diagnosis not present

## 2011-06-24 ENCOUNTER — Other Ambulatory Visit (HOSPITAL_COMMUNITY): Payer: Self-pay | Admitting: Internal Medicine

## 2011-06-24 DIAGNOSIS — F411 Generalized anxiety disorder: Secondary | ICD-10-CM | POA: Diagnosis not present

## 2011-06-24 DIAGNOSIS — Z6828 Body mass index (BMI) 28.0-28.9, adult: Secondary | ICD-10-CM | POA: Diagnosis not present

## 2011-06-24 DIAGNOSIS — R109 Unspecified abdominal pain: Secondary | ICD-10-CM | POA: Diagnosis not present

## 2011-06-24 DIAGNOSIS — I2510 Atherosclerotic heart disease of native coronary artery without angina pectoris: Secondary | ICD-10-CM | POA: Diagnosis not present

## 2011-06-28 ENCOUNTER — Other Ambulatory Visit (HOSPITAL_COMMUNITY): Payer: Medicare Other

## 2011-07-07 ENCOUNTER — Emergency Department (HOSPITAL_COMMUNITY)
Admission: EM | Admit: 2011-07-07 | Discharge: 2011-07-07 | Disposition: A | Discharge status: Home / Self Care | Payer: Medicare Other | Attending: Emergency Medicine | Admitting: Emergency Medicine

## 2011-07-07 ENCOUNTER — Encounter (HOSPITAL_COMMUNITY): Payer: Self-pay | Admitting: *Deleted

## 2011-07-07 DIAGNOSIS — Z951 Presence of aortocoronary bypass graft: Secondary | ICD-10-CM | POA: Diagnosis not present

## 2011-07-07 DIAGNOSIS — F3289 Other specified depressive episodes: Secondary | ICD-10-CM | POA: Insufficient documentation

## 2011-07-07 DIAGNOSIS — K219 Gastro-esophageal reflux disease without esophagitis: Secondary | ICD-10-CM | POA: Diagnosis not present

## 2011-07-07 DIAGNOSIS — R229 Localized swelling, mass and lump, unspecified: Secondary | ICD-10-CM | POA: Diagnosis not present

## 2011-07-07 DIAGNOSIS — I10 Essential (primary) hypertension: Secondary | ICD-10-CM | POA: Diagnosis not present

## 2011-07-07 DIAGNOSIS — F329 Major depressive disorder, single episode, unspecified: Secondary | ICD-10-CM | POA: Diagnosis not present

## 2011-07-07 DIAGNOSIS — G4700 Insomnia, unspecified: Secondary | ICD-10-CM | POA: Diagnosis not present

## 2011-07-07 DIAGNOSIS — I2510 Atherosclerotic heart disease of native coronary artery without angina pectoris: Secondary | ICD-10-CM | POA: Insufficient documentation

## 2011-07-07 DIAGNOSIS — Z87891 Personal history of nicotine dependence: Secondary | ICD-10-CM | POA: Diagnosis not present

## 2011-07-07 NOTE — ED Provider Notes (Signed)
History    This chart was scribed for Felisa Bonier, MD, MD by Smitty Pluck. The patient was seen in room [facility] and the patient's care was started at 6:18PM.   CSN: [id number]  Arrival date & time 07/07/11  1638   First MD Initiated Contact with Patient 07/07/11 1734      Chief Complaint  Patient presents with  . Leg Pain    (Consider location/radiation/quality/duration/timing/severity/associated sxs/prior treatment) The history is provided by the patient.   Rodney Decker is a 71 y.o. male who presents to the Emergency Department complaining of moderate left thigh pain due to "knot" in leg onset 2 days ago. Pt reports having open heart surgery 2 years ago and having to walk everyday for exercise but he has not walked for exercise in 3 days and he reports noticing the "knot." He reports that he had "one removed" before. The symptoms have been constant since onset without radiation.  PCP is Dr. Sherwood Gambler   Past Medical History  Diagnosis Date  . Hypertension   . CAD (coronary artery disease)   . Insomnia   . Depression   . Colon polyps   . GERD (gastroesophageal reflux disease)     Past Surgical History  Procedure Date  . Colonoscopy 10/10/2001 for surveillqance for colonic polyps.     Biopsy revealed hyperplastic polyps. Next colonoscopy in 3 yrs.  . Coronary artery bypass graft   . Appendectomy   . Hernia repair   . Colonoscopy 03/24/2011    Procedure: COLONOSCOPY;  Surgeon: Malissa Hippo, MD;  Location: [facility] SUITE;  Service: Endoscopy;  Laterality: N/A;  9:30  . Cardiac surgery     Family History  Problem Relation Age of Onset  . Healthy Mother   . Colon cancer Neg Hx     History  Substance Use Topics  . Smoking status: Former Smoker -- 0.5 packs/day for 40 years    Types: Cigarettes    Quit date: 03/14/2005  . Smokeless tobacco: Never Used   Comment: Patient states that he smoked 10 cigarettes a day  . Alcohol Use: No      Review of Systems  All  other systems reviewed and are negative.   10 Systems reviewed and all are negative for acute change except as noted in the HPI.   Allergies  Review of patient's allergies indicates no known allergies.  Home Medications   Current Outpatient Rx  Name Route Sig Dispense Refill  . ESOMEPRAZOLE MAGNESIUM 40 MG PO CPDR Oral Take 40 mg by mouth daily before breakfast.     . LISINOPRIL 5 MG PO TABS Oral Take 5 mg by mouth daily.      Marland Kitchen POLYETHYLENE GLYCOL 3350 PO POWD Oral Take 17 g by mouth daily. Take 1/2  To 1 scoop daily     . ROSUVASTATIN CALCIUM 10 MG PO TABS Oral Take 10 mg by mouth daily.       BP 134/89  Pulse 83  Temp(Src) 97.6 F (36.4 C) (Oral)  Resp 20  Ht 5\' 11"  (1.803 m)  Wt 200 lb (90.719 kg)  BMI 27.89 kg/m2  SpO2 97%  Physical Exam  Nursing note and vitals reviewed. Constitutional: He is oriented to person, place, and time. He appears well-developed and well-nourished. No distress.  HENT:  Head: Normocephalic and atraumatic.  Eyes: Conjunctivae and EOM are normal. Pupils are equal, round, and reactive to light.  Neck: Normal range of motion. Neck supple. No JVD present.  Cardiovascular: Normal rate, regular rhythm and normal heart sounds.  Exam reveals no gallop and no friction rub.   No murmur heard. Pulmonary/Chest: Effort normal and breath sounds normal. No respiratory distress. He has no wheezes. He has no rales.  Musculoskeletal: He exhibits no edema and no tenderness.       Less than 1 cm smooth firm rounded nodule along mid thigh left leg lateral aspect  Non-tender to palpation No adhesion skin moves freely Does not feel like abscess Location not compatible with DVT No palpable cord to posterior calf No tenderness to posterior calf    Neurological: He is alert and oriented to person, place, and time. He has normal reflexes. He exhibits normal muscle tone.  Skin: Skin is warm and dry. No rash noted. No erythema. No pallor.  Psychiatric: He has a  normal mood and affect. His behavior is normal.    ED Course  Procedures (including critical care time) DIAGNOSTIC STUDIES: Oxygen Saturation is 97% on room air, normal by my interpretation.    COORDINATION OF CARE: 6:32PM EDP discusses pt ED treatment course with pt.    Labs Reviewed - No data to display No results found.   No diagnosis found.    MDM  The location of the lesion and the impression of a semi-physical examination is not compatible with a deep vein thrombosis and I am not concerned about this being the underlying cause and reassured the patient of same. I believe that this is likely a subcutaneous, possibly muscular in origin, nodule of undetermined nature, and I have scheduled the patient to come back for an ultrasound tomorrow to further evaluate it and followup with his primary care physician.   I personally performed the services described in this documentation, which was scribed in my presence. The recorded information has been reviewed and considered.         Felisa Bonier, MD 07/07/11 [phone]

## 2011-07-07 NOTE — ED Notes (Signed)
"  knot in lt thigh" for 2 days,

## 2011-07-07 NOTE — Discharge Instructions (Signed)
ULTRASOUND scheduled for 8:00 am Thursday morning.  Go to radiology department to register - be there at 7:45am please to check in.

## 2011-07-07 NOTE — ED Notes (Signed)
Pt has "knot" to outer left thigh. Pt reports pain with ambulation. Pt states had open heart surgery x 2 years ago. Reports sharp pain to neck at times between yesterday and today- denies injury.

## 2011-07-08 ENCOUNTER — Ambulatory Visit (HOSPITAL_COMMUNITY)
Admit: 2011-07-08 | Discharge: 2011-07-08 | Disposition: A | Discharge status: Home / Self Care | Payer: Medicare Other | Source: Ambulatory Visit | Attending: Emergency Medicine | Admitting: Emergency Medicine

## 2011-07-08 DIAGNOSIS — M799 Soft tissue disorder, unspecified: Secondary | ICD-10-CM | POA: Diagnosis not present

## 2011-07-08 DIAGNOSIS — M7989 Other specified soft tissue disorders: Secondary | ICD-10-CM | POA: Diagnosis not present

## 2011-07-08 DIAGNOSIS — R229 Localized swelling, mass and lump, unspecified: Secondary | ICD-10-CM | POA: Diagnosis not present

## 2011-07-15 ENCOUNTER — Emergency Department (HOSPITAL_COMMUNITY)
Admission: EM | Admit: 2011-07-15 | Discharge: 2011-07-15 | Disposition: A | Discharge status: Home / Self Care | Payer: Medicare Other | Attending: Emergency Medicine | Admitting: Emergency Medicine

## 2011-07-15 DIAGNOSIS — R0789 Other chest pain: Secondary | ICD-10-CM | POA: Insufficient documentation

## 2011-07-15 DIAGNOSIS — F411 Generalized anxiety disorder: Secondary | ICD-10-CM | POA: Diagnosis not present

## 2011-07-15 DIAGNOSIS — F419 Anxiety disorder, unspecified: Secondary | ICD-10-CM

## 2011-07-15 DIAGNOSIS — F19939 Other psychoactive substance use, unspecified with withdrawal, unspecified: Secondary | ICD-10-CM | POA: Insufficient documentation

## 2011-07-15 DIAGNOSIS — F1920 Other psychoactive substance dependence, uncomplicated: Secondary | ICD-10-CM | POA: Insufficient documentation

## 2011-07-15 DIAGNOSIS — R079 Chest pain, unspecified: Secondary | ICD-10-CM | POA: Insufficient documentation

## 2011-07-15 DIAGNOSIS — I2510 Atherosclerotic heart disease of native coronary artery without angina pectoris: Secondary | ICD-10-CM | POA: Insufficient documentation

## 2011-07-15 DIAGNOSIS — I10 Essential (primary) hypertension: Secondary | ICD-10-CM | POA: Insufficient documentation

## 2011-07-15 LAB — POCT I-STAT TROPONIN I: Troponin i, poc: 0 ng/mL (ref 0.00–0.08)

## 2011-07-15 MED ORDER — LORAZEPAM 1 MG PO TABS
2.0000 mg | ORAL_TABLET | Freq: Once | ORAL | Status: AC
  Administered 2011-07-15: 2 mg via ORAL
  Filled 2011-07-15: qty 2

## 2011-07-15 MED ORDER — LORAZEPAM 1 MG PO TABS: 1.0000 mg | ORAL_TABLET | Freq: Three times a day (TID) | ORAL | Status: AC | PRN

## 2011-07-15 NOTE — ED Provider Notes (Signed)
History   This chart was scribed for Rodney Hutching, MD by Brooks Sailors. The patient was seen in room APA07/APA07. Patient's care was started at 1721.   CSN: [id number]  Arrival date & time 07/15/11  1721   First MD Initiated Contact with Patient 07/15/11 1743      Chief Complaint  Patient presents with  . Withdrawal    (Consider location/radiation/quality/duration/timing/severity/associated sxs/prior treatment) HPI  Rodney Decker is a 71 y.o. male who presents to the Emergency Department complaining of constant moderate withdrawal symptoms after running out of ativan two days ago. Patient states he has a history of panic attacks and is under a lot of stress taking care of his elderly mother. Patient states that he takes Ativan once every four hours. Additionaly patient had mild chest tightness and brief similar symptoms to that of an earlier heart attack, on his way to the bathroom this morning at 5:30AM before bowel movement. Patient currently has chest tightness but says that is always persistent.   PCP Dr. Sherwood Gambler.   Past Medical History  Diagnosis Date  . Hypertension   . CAD (coronary artery disease)   . Insomnia   . Depression   . Colon polyps   . GERD (gastroesophageal reflux disease)     Past Surgical History  Procedure Date  . Colonoscopy 10/10/2001 for surveillqance for colonic polyps.     Biopsy revealed hyperplastic polyps. Next colonoscopy in 3 yrs.  . Coronary artery bypass graft   . Appendectomy   . Hernia repair   . Colonoscopy 03/24/2011    Procedure: COLONOSCOPY;  Surgeon: Malissa Hippo, MD;  Location: [facility];  Service: Endoscopy;  Laterality: N/A;  9:30  . Cardiac surgery     Family History  Problem Relation Age of Onset  . Healthy Mother   . Colon cancer Neg Hx     History  Substance Use Topics  . Smoking status: Former Smoker -- 0.5 packs/day for 40 years    Types: Cigarettes    Quit date: 03/14/2005  . Smokeless tobacco: Never  Used   Comment: Patient states that he smoked 10 cigarettes a day  . Alcohol Use: No      Review of Systems  All other systems reviewed and are negative.    Allergies  Review of patient's allergies indicates no known allergies.  Home Medications   Current Outpatient Rx  Name Route Sig Dispense Refill  . ESOMEPRAZOLE MAGNESIUM 40 MG PO CPDR Oral Take 40 mg by mouth daily before breakfast.     . LISINOPRIL 5 MG PO TABS Oral Take 5 mg by mouth daily.      Marland Kitchen POLYETHYLENE GLYCOL 3350 PO POWD Oral Take 17 g by mouth daily. Take 1/2  To 1 scoop daily     . ROSUVASTATIN CALCIUM 10 MG PO TABS Oral Take 10 mg by mouth daily.       BP 147/80  Pulse 70  Temp(Src) 97.9 F (36.6 C) (Oral)  Resp 20  Ht 5\' 11"  (1.803 m)  Wt 195 lb (88.451 kg)  BMI 27.20 kg/m2  SpO2 95%  Physical Exam  Nursing note and vitals reviewed. Constitutional: He is oriented to person, place, and time. He appears well-developed and well-nourished.  HENT:  Head: Normocephalic and atraumatic.  Eyes: Conjunctivae and EOM are normal. Pupils are equal, round, and reactive to light.  Neck: Normal range of motion. Neck supple.  Cardiovascular: Normal rate and regular rhythm.   Pulmonary/Chest: Effort  normal and breath sounds normal.  Abdominal: Soft. Bowel sounds are normal.  Musculoskeletal: Normal range of motion.  Neurological: He is alert and oriented to person, place, and time.  Skin: Skin is warm and dry.  Psychiatric: He has a normal mood and affect.    ED Course  Procedures (including critical care time) DIAGNOSTIC STUDIES: Oxygen Saturation is 95% on room air, adequate by my interpretation.    COORDINATION OF CARE: 5:55PM - Ordering cardiac enzymes test and EKG. Prescribing Ativan for one week. Patient to follow up with PCP Dr. Sherwood Gambler.    Labs Reviewed - No data to display No results found.   No diagnosis found.  Results for orders placed during the hospital encounter of 07/15/11  POCT  I-STAT TROPONIN I      Component Value Range   Troponin i, poc 0.00  0.00 - 0.08 (ng/mL)   Comment 3           No results found.  MDM  Patient is in no acute distress.  He has been on benzodiazepines for a long period of time.  Suspect withdrawal phenomenon.  screening tests normal.       I personally performed the services described in this documentation, which was scribed in my presence. The recorded information has been reviewed and considered.    Rodney Hutching, MD 07/25/11 1529

## 2011-07-15 NOTE — ED Notes (Signed)
Out of ativan , has been taking ativan for over a year, states he has not had any ativan for 3 days, states he has been seeing things

## 2011-07-15 NOTE — ED Notes (Signed)
Pt states has been taking Ativan for over a year, and "wants to get off the ativan". Pt reports feeling like he is in a fog, has decreased appetite and feels jittery. Pt needs a refill on ativan. Pt reports sweating, loose stools,decreased appetite and sneezing.

## 2011-07-15 NOTE — Discharge Instructions (Signed)
EKG and cardiac enzymes were normal. Refill Ativan. Followup your primary care Dr.

## 2011-07-15 NOTE — ED Notes (Signed)
Pt states feels "much better". Pt is quietly watching TV. No c/o voiced.

## 2011-07-30 ENCOUNTER — Other Ambulatory Visit (HOSPITAL_COMMUNITY): Payer: Self-pay | Admitting: Internal Medicine

## 2011-07-30 DIAGNOSIS — N400 Enlarged prostate without lower urinary tract symptoms: Secondary | ICD-10-CM | POA: Diagnosis not present

## 2011-07-30 DIAGNOSIS — Z6829 Body mass index (BMI) 29.0-29.9, adult: Secondary | ICD-10-CM | POA: Diagnosis not present

## 2011-07-30 DIAGNOSIS — K219 Gastro-esophageal reflux disease without esophagitis: Secondary | ICD-10-CM | POA: Diagnosis not present

## 2011-07-30 DIAGNOSIS — R109 Unspecified abdominal pain: Secondary | ICD-10-CM

## 2011-07-30 DIAGNOSIS — F411 Generalized anxiety disorder: Secondary | ICD-10-CM | POA: Diagnosis not present

## 2011-07-30 DIAGNOSIS — G8929 Other chronic pain: Secondary | ICD-10-CM | POA: Diagnosis not present

## 2011-08-03 ENCOUNTER — Ambulatory Visit (HOSPITAL_COMMUNITY): Admission: RE | Admit: 2011-08-03 | Payer: Medicare Other | Source: Ambulatory Visit

## 2011-08-04 ENCOUNTER — Encounter (HOSPITAL_COMMUNITY): Payer: Self-pay

## 2011-08-04 ENCOUNTER — Ambulatory Visit (HOSPITAL_COMMUNITY)
Admission: RE | Admit: 2011-08-04 | Discharge: 2011-08-04 | Disposition: A | Discharge status: Home / Self Care | Payer: Medicare Other | Source: Ambulatory Visit | Attending: Internal Medicine | Admitting: Internal Medicine

## 2011-08-04 DIAGNOSIS — K429 Umbilical hernia without obstruction or gangrene: Secondary | ICD-10-CM | POA: Diagnosis not present

## 2011-08-04 DIAGNOSIS — I10 Essential (primary) hypertension: Secondary | ICD-10-CM | POA: Diagnosis not present

## 2011-08-04 DIAGNOSIS — K219 Gastro-esophageal reflux disease without esophagitis: Secondary | ICD-10-CM | POA: Diagnosis not present

## 2011-08-04 DIAGNOSIS — R918 Other nonspecific abnormal finding of lung field: Secondary | ICD-10-CM | POA: Insufficient documentation

## 2011-08-04 DIAGNOSIS — R1011 Right upper quadrant pain: Secondary | ICD-10-CM | POA: Insufficient documentation

## 2011-08-04 DIAGNOSIS — R109 Unspecified abdominal pain: Secondary | ICD-10-CM

## 2011-08-04 MED ORDER — IOHEXOL 300 MG/ML  SOLN
100.0000 mL | Freq: Once | INTRAMUSCULAR | Status: AC | PRN
  Administered 2011-08-04: 100 mL via INTRAVENOUS

## 2011-08-10 ENCOUNTER — Encounter (INDEPENDENT_AMBULATORY_CARE_PROVIDER_SITE_OTHER): Payer: Self-pay

## 2011-08-19 ENCOUNTER — Encounter (HOSPITAL_COMMUNITY): Payer: Self-pay | Admitting: Emergency Medicine

## 2011-08-19 ENCOUNTER — Emergency Department (HOSPITAL_COMMUNITY)
Admission: EM | Admit: 2011-08-19 | Discharge: 2011-08-20 | Disposition: A | Discharge status: Home / Self Care | Payer: Medicare Other | Attending: Emergency Medicine | Admitting: Emergency Medicine

## 2011-08-19 DIAGNOSIS — X30XXXA Exposure to excessive natural heat, initial encounter: Secondary | ICD-10-CM | POA: Insufficient documentation

## 2011-08-19 DIAGNOSIS — I2510 Atherosclerotic heart disease of native coronary artery without angina pectoris: Secondary | ICD-10-CM | POA: Diagnosis not present

## 2011-08-19 DIAGNOSIS — T6701XA Heatstroke and sunstroke, initial encounter: Secondary | ICD-10-CM | POA: Insufficient documentation

## 2011-08-19 DIAGNOSIS — F329 Major depressive disorder, single episode, unspecified: Secondary | ICD-10-CM | POA: Diagnosis not present

## 2011-08-19 DIAGNOSIS — K219 Gastro-esophageal reflux disease without esophagitis: Secondary | ICD-10-CM | POA: Diagnosis not present

## 2011-08-19 DIAGNOSIS — I10 Essential (primary) hypertension: Secondary | ICD-10-CM | POA: Diagnosis not present

## 2011-08-19 DIAGNOSIS — R079 Chest pain, unspecified: Secondary | ICD-10-CM | POA: Diagnosis not present

## 2011-08-19 DIAGNOSIS — Z79899 Other long term (current) drug therapy: Secondary | ICD-10-CM | POA: Insufficient documentation

## 2011-08-19 DIAGNOSIS — F411 Generalized anxiety disorder: Secondary | ICD-10-CM | POA: Diagnosis not present

## 2011-08-19 DIAGNOSIS — R110 Nausea: Secondary | ICD-10-CM | POA: Insufficient documentation

## 2011-08-19 DIAGNOSIS — F3289 Other specified depressive episodes: Secondary | ICD-10-CM | POA: Insufficient documentation

## 2011-08-19 DIAGNOSIS — R61 Generalized hyperhidrosis: Secondary | ICD-10-CM | POA: Diagnosis not present

## 2011-08-19 DIAGNOSIS — R109 Unspecified abdominal pain: Secondary | ICD-10-CM | POA: Insufficient documentation

## 2011-08-19 LAB — BASIC METABOLIC PANEL
BUN: 21 mg/dL (ref 6–23)
Calcium: 10.2 mg/dL (ref 8.4–10.5)
Creatinine, Ser: 0.86 mg/dL (ref 0.50–1.35)
GFR calc non Af Amer: 86 mL/min — ABNORMAL LOW (ref >90)
Glucose, Bld: 111 mg/dL — ABNORMAL HIGH (ref 70–99)

## 2011-08-19 LAB — DIFFERENTIAL
Eosinophils Absolute: 0.1 10*3/uL (ref 0.0–0.7)
Eosinophils Relative: 1 % (ref 0–5)
Lymphs Abs: 2 10*3/uL (ref 0.7–4.0)
Monocytes Absolute: 1.2 10*3/uL — ABNORMAL HIGH (ref 0.1–1.0)
Monocytes Relative: 9 % (ref 3–12)

## 2011-08-19 LAB — CBC
Hemoglobin: 14.7 g/dL (ref 13.0–17.0)
MCH: 30.9 pg (ref 26.0–34.0)
MCV: 89.9 fL (ref 78.0–100.0)
RBC: 4.76 MIL/uL (ref 4.22–5.81)

## 2011-08-19 LAB — CARDIAC PANEL(CRET KIN+CKTOT+MB+TROPI): Troponin I: <0.3 ng/mL (ref <0.30)

## 2011-08-19 MED ORDER — SODIUM CHLORIDE 0.9 % IV BOLUS (SEPSIS)
1000.0000 mL | Freq: Once | INTRAVENOUS | Status: AC
  Administered 2011-08-19: 1000 mL via INTRAVENOUS

## 2011-08-19 MED ORDER — LORAZEPAM 2 MG/ML IJ SOLN
1.0000 mg | Freq: Once | INTRAMUSCULAR | Status: AC
  Administered 2011-08-19: 1 mg via INTRAVENOUS
  Filled 2011-08-19: qty 1

## 2011-08-19 NOTE — ED Provider Notes (Signed)
History     CSN: [id number]  Arrival date & time 08/19/11  Rodney Decker   First MD Initiated Contact with Patient 08/19/11 1951      Chief Complaint  Patient presents with  . Excessive Sweating  . Abdominal Pain  . Nausea    (Consider location/radiation/quality/duration/timing/severity/associated sxs/prior treatment) HPI... sweating, nausea, abdominal discomfort after mowing lawn tonight. Feeling much better now. Symptoms are minimal to moderate. No radiation. Slight chest pain; no dyspnea.  Patient has known anxiety component to his history. He admits to taking Ativan on a regular basis   Past Medical History  Diagnosis Date  . Hypertension   . CAD (coronary artery disease)   . Insomnia   . Depression   . Colon polyps   . GERD (gastroesophageal reflux disease)     Past Surgical History  Procedure Date  . Colonoscopy 10/10/2001 for surveillqance for colonic polyps.     Biopsy revealed hyperplastic polyps. Next colonoscopy in 3 yrs.  . Coronary artery bypass graft   . Appendectomy   . Hernia repair   . Colonoscopy 03/24/2011    Procedure: COLONOSCOPY;  Surgeon: Malissa Hippo, MD;  Location: [facility];  Service: Endoscopy;  Laterality: N/A;  9:30  . Cardiac surgery     Family History  Problem Relation Age of Onset  . Healthy Mother   . Colon cancer Neg Hx     History  Substance Use Topics  . Smoking status: Former Smoker -- 0.5 packs/day for 40 years    Types: Cigarettes    Quit date: 03/14/2005  . Smokeless tobacco: Never Used   Comment: Patient states that he smoked 10 cigarettes a day  . Alcohol Use: No      Review of Systems  All other systems reviewed and are negative.    Allergies  Cymbalta  Home Medications   Current Outpatient Rx  Name Route Sig Dispense Refill  . CLOPIDOGREL BISULFATE 75 MG PO TABS Oral Take 75 mg by mouth daily.    Marland Kitchen ESOMEPRAZOLE MAGNESIUM 40 MG PO CPDR Oral Take 40 mg by mouth daily as needed. For acid reflux    .  LISINOPRIL 5 MG PO TABS Oral Take 5 mg by mouth daily.     Marland Kitchen LORAZEPAM 2 MG PO TABS Oral Take 2 mg by mouth daily as needed. For anxiety/sleep    . ROSUVASTATIN CALCIUM 10 MG PO TABS Oral Take 10 mg by mouth at bedtime.       BP 131/76  Pulse 71  Temp(Src) 98.1 F (36.7 C) (Oral)  Resp 20  Ht 5\' 11"  (1.803 m)  Wt 195 lb (88.451 kg)  BMI 27.20 kg/m2  SpO2 96%  Physical Exam  Nursing note and vitals reviewed. Constitutional: He is oriented to person, place, and time. He appears well-developed and well-nourished.  HENT:  Head: Normocephalic and atraumatic.  Eyes: Conjunctivae and EOM are normal. Pupils are equal, round, and reactive to light.  Neck: Normal range of motion. Neck supple.  Cardiovascular: Normal rate and regular rhythm.   Pulmonary/Chest: Effort normal and breath sounds normal.  Abdominal: Soft. Bowel sounds are normal.  Musculoskeletal: Normal range of motion.  Neurological: He is alert and oriented to person, place, and time.  Skin: Skin is warm and dry.  Psychiatric: He has a normal mood and affect.    ED Course  Procedures (including critical care time)   Labs Reviewed  CBC  DIFFERENTIAL  BASIC METABOLIC PANEL  CARDIAC PANEL(CRET KIN+CKTOT+MB+TROPI)  No results found. . Results for orders placed during the hospital encounter of 08/19/11  CBC      Component Value Range   WBC 13.3 (*) 4.0 - 10.5 (K/uL)   RBC 4.76  4.22 - 5.81 (MIL/uL)   Hemoglobin 14.7  13.0 - 17.0 (g/dL)   HCT 04.5  40.9 - 81.1 (%)   MCV 89.9  78.0 - 100.0 (fL)   MCH 30.9  26.0 - 34.0 (pg)   MCHC 34.3  30.0 - 36.0 (g/dL)   RDW 91.4  78.2 - 95.6 (%)   Platelets 295  150 - 400 (K/uL)  DIFFERENTIAL      Component Value Range   Neutrophils Relative 76  43 - 77 (%)   Neutro Abs 10.1 (*) 1.7 - 7.7 (K/uL)   Lymphocytes Relative 15  12 - 46 (%)   Lymphs Abs 2.0  0.7 - 4.0 (K/uL)   Monocytes Relative 9  3 - 12 (%)   Monocytes Absolute 1.2 (*) 0.1 - 1.0 (K/uL)   Eosinophils Relative  1  0 - 5 (%)   Eosinophils Absolute 0.1  0.0 - 0.7 (K/uL)   Basophils Relative 0  0 - 1 (%)   Basophils Absolute 0.0  0.0 - 0.1 (K/uL)  BASIC METABOLIC PANEL      Component Value Range   Sodium 133 (*) 135 - 145 (mEq/L)   Potassium 3.9  3.5 - 5.1 (mEq/L)   Chloride 97  96 - 112 (mEq/L)   CO2 24  19 - 32 (mEq/L)   Glucose, Bld 111 (*) 70 - 99 (mg/dL)   BUN 21  6 - 23 (mg/dL)   Creatinine, Ser 2.13  0.50 - 1.35 (mg/dL)   Calcium 08.6  8.4 - 10.5 (mg/dL)   GFR calc non Af Amer 86 (*) >90 (mL/min)   GFR calc Af Amer >90  >90 (mL/min)  CARDIAC PANEL(CRET KIN+CKTOT+MB+TROPI)      Component Value Range   Total CK 170  7 - 232 (U/L)   CK, MB 5.4 (*) 0.3 - 4.0 (ng/mL)   Troponin I <0.30  <0.30 (ng/mL)   Relative Index 3.2 (*) 0.0 - 2.5     No diagnosis found.   Date: 08/19/2011  Rate: 73  Rhythm: normal sinus rhythm  QRS Axis: normal  Intervals: normal  ST/T Wave abnormalities: normal  Conduction Disutrbances: none  Narrative Interpretation: unremarkable     MDM  Patient feeling much better..  no chest pain, nausea, dyspnea.  Total CPK normal.  Questionable clinical significance of relative index of 3.2% with normal total CPK.  Discharge home. Follow up with primary care Dr.       Donnetta Hutching, MD 08/20/11 0002

## 2011-08-19 NOTE — ED Notes (Signed)
Patient states he has been under a lot of stress and that he is out of his Ativan and Ambien and has not been sleeping.

## 2011-08-19 NOTE — Discharge Instructions (Signed)
Increase fluids. Rest. Do not do excessive exercising and the heat

## 2011-08-19 NOTE — ED Notes (Signed)
Patient presents to ER with c/o sweating, nausea and lower left abdominal pain after mowing the lawn today.

## 2011-08-19 NOTE — ED Notes (Signed)
Patient lying in bed sleeping at this time. Rise and fall of chest noted. No obvious distress noted at this time.

## 2011-08-27 ENCOUNTER — Encounter (INDEPENDENT_AMBULATORY_CARE_PROVIDER_SITE_OTHER): Payer: Self-pay

## 2011-09-13 DIAGNOSIS — R319 Hematuria, unspecified: Secondary | ICD-10-CM | POA: Diagnosis not present

## 2011-09-13 DIAGNOSIS — Z6829 Body mass index (BMI) 29.0-29.9, adult: Secondary | ICD-10-CM | POA: Diagnosis not present

## 2011-09-13 DIAGNOSIS — I2510 Atherosclerotic heart disease of native coronary artery without angina pectoris: Secondary | ICD-10-CM | POA: Diagnosis not present

## 2011-09-13 DIAGNOSIS — F411 Generalized anxiety disorder: Secondary | ICD-10-CM | POA: Diagnosis not present

## 2011-09-14 DIAGNOSIS — Z79899 Other long term (current) drug therapy: Secondary | ICD-10-CM | POA: Diagnosis not present

## 2011-09-14 DIAGNOSIS — E782 Mixed hyperlipidemia: Secondary | ICD-10-CM | POA: Diagnosis not present

## 2011-09-16 DIAGNOSIS — E782 Mixed hyperlipidemia: Secondary | ICD-10-CM | POA: Diagnosis not present

## 2011-09-16 DIAGNOSIS — K219 Gastro-esophageal reflux disease without esophagitis: Secondary | ICD-10-CM | POA: Diagnosis not present

## 2011-09-16 DIAGNOSIS — I10 Essential (primary) hypertension: Secondary | ICD-10-CM | POA: Diagnosis not present

## 2011-09-16 DIAGNOSIS — Z951 Presence of aortocoronary bypass graft: Secondary | ICD-10-CM | POA: Diagnosis not present

## 2011-10-08 DIAGNOSIS — F438 Other reactions to severe stress: Secondary | ICD-10-CM | POA: Diagnosis not present

## 2011-10-08 DIAGNOSIS — F411 Generalized anxiety disorder: Secondary | ICD-10-CM | POA: Diagnosis not present

## 2011-10-08 DIAGNOSIS — Z6828 Body mass index (BMI) 28.0-28.9, adult: Secondary | ICD-10-CM | POA: Diagnosis not present

## 2011-10-19 DIAGNOSIS — E119 Type 2 diabetes mellitus without complications: Secondary | ICD-10-CM | POA: Diagnosis not present

## 2011-10-19 DIAGNOSIS — F411 Generalized anxiety disorder: Secondary | ICD-10-CM | POA: Diagnosis not present

## 2011-10-19 DIAGNOSIS — E785 Hyperlipidemia, unspecified: Secondary | ICD-10-CM | POA: Diagnosis not present

## 2011-10-19 DIAGNOSIS — M159 Polyosteoarthritis, unspecified: Secondary | ICD-10-CM | POA: Diagnosis not present

## 2011-10-19 DIAGNOSIS — I10 Essential (primary) hypertension: Secondary | ICD-10-CM | POA: Diagnosis not present

## 2011-10-19 DIAGNOSIS — M545 Low back pain: Secondary | ICD-10-CM | POA: Diagnosis not present

## 2011-10-26 DIAGNOSIS — M159 Polyosteoarthritis, unspecified: Secondary | ICD-10-CM | POA: Diagnosis not present

## 2011-10-26 DIAGNOSIS — F411 Generalized anxiety disorder: Secondary | ICD-10-CM | POA: Diagnosis not present

## 2011-10-26 DIAGNOSIS — I10 Essential (primary) hypertension: Secondary | ICD-10-CM | POA: Diagnosis not present

## 2011-10-26 DIAGNOSIS — M545 Low back pain: Secondary | ICD-10-CM | POA: Diagnosis not present

## 2011-10-26 DIAGNOSIS — E785 Hyperlipidemia, unspecified: Secondary | ICD-10-CM | POA: Diagnosis not present

## 2011-10-26 DIAGNOSIS — E119 Type 2 diabetes mellitus without complications: Secondary | ICD-10-CM | POA: Diagnosis not present

## 2011-10-29 DIAGNOSIS — M545 Low back pain: Secondary | ICD-10-CM | POA: Diagnosis not present

## 2011-10-29 DIAGNOSIS — M159 Polyosteoarthritis, unspecified: Secondary | ICD-10-CM | POA: Diagnosis not present

## 2011-10-29 DIAGNOSIS — F411 Generalized anxiety disorder: Secondary | ICD-10-CM | POA: Diagnosis not present

## 2011-10-29 DIAGNOSIS — E785 Hyperlipidemia, unspecified: Secondary | ICD-10-CM | POA: Diagnosis not present

## 2011-10-29 DIAGNOSIS — I10 Essential (primary) hypertension: Secondary | ICD-10-CM | POA: Diagnosis not present

## 2011-10-29 DIAGNOSIS — E119 Type 2 diabetes mellitus without complications: Secondary | ICD-10-CM | POA: Diagnosis not present

## 2011-11-03 DIAGNOSIS — M159 Polyosteoarthritis, unspecified: Secondary | ICD-10-CM | POA: Diagnosis not present

## 2011-11-03 DIAGNOSIS — E785 Hyperlipidemia, unspecified: Secondary | ICD-10-CM | POA: Diagnosis not present

## 2011-11-03 DIAGNOSIS — M545 Low back pain: Secondary | ICD-10-CM | POA: Diagnosis not present

## 2011-11-03 DIAGNOSIS — F411 Generalized anxiety disorder: Secondary | ICD-10-CM | POA: Diagnosis not present

## 2011-11-03 DIAGNOSIS — E119 Type 2 diabetes mellitus without complications: Secondary | ICD-10-CM | POA: Diagnosis not present

## 2011-11-03 DIAGNOSIS — I10 Essential (primary) hypertension: Secondary | ICD-10-CM | POA: Diagnosis not present

## 2011-11-16 DIAGNOSIS — I2510 Atherosclerotic heart disease of native coronary artery without angina pectoris: Secondary | ICD-10-CM | POA: Diagnosis not present

## 2011-11-16 DIAGNOSIS — F411 Generalized anxiety disorder: Secondary | ICD-10-CM | POA: Diagnosis not present

## 2011-11-16 DIAGNOSIS — K219 Gastro-esophageal reflux disease without esophagitis: Secondary | ICD-10-CM | POA: Diagnosis not present

## 2011-11-16 DIAGNOSIS — Z713 Dietary counseling and surveillance: Secondary | ICD-10-CM | POA: Diagnosis not present

## 2011-11-16 DIAGNOSIS — Z6830 Body mass index (BMI) 30.0-30.9, adult: Secondary | ICD-10-CM | POA: Diagnosis not present

## 2011-12-04 DIAGNOSIS — Z6831 Body mass index (BMI) 31.0-31.9, adult: Secondary | ICD-10-CM | POA: Diagnosis not present

## 2011-12-04 DIAGNOSIS — I2510 Atherosclerotic heart disease of native coronary artery without angina pectoris: Secondary | ICD-10-CM | POA: Diagnosis not present

## 2011-12-04 DIAGNOSIS — G8929 Other chronic pain: Secondary | ICD-10-CM | POA: Diagnosis not present

## 2012-02-10 DIAGNOSIS — I2510 Atherosclerotic heart disease of native coronary artery without angina pectoris: Secondary | ICD-10-CM | POA: Diagnosis not present

## 2012-02-10 DIAGNOSIS — G8929 Other chronic pain: Secondary | ICD-10-CM | POA: Diagnosis not present

## 2012-02-10 DIAGNOSIS — Z6833 Body mass index (BMI) 33.0-33.9, adult: Secondary | ICD-10-CM | POA: Diagnosis not present

## 2012-02-10 DIAGNOSIS — C44300 Unspecified malignant neoplasm of skin of unspecified part of face: Secondary | ICD-10-CM | POA: Diagnosis not present

## 2012-03-06 ENCOUNTER — Encounter (INDEPENDENT_AMBULATORY_CARE_PROVIDER_SITE_OTHER): Payer: Self-pay | Admitting: *Deleted

## 2012-04-11 DIAGNOSIS — D2360 Other benign neoplasm of skin of unspecified upper limb, including shoulder: Secondary | ICD-10-CM | POA: Diagnosis not present

## 2012-04-11 DIAGNOSIS — D235 Other benign neoplasm of skin of trunk: Secondary | ICD-10-CM | POA: Diagnosis not present

## 2012-04-11 DIAGNOSIS — L570 Actinic keratosis: Secondary | ICD-10-CM | POA: Diagnosis not present

## 2012-04-13 DIAGNOSIS — G8929 Other chronic pain: Secondary | ICD-10-CM | POA: Diagnosis not present

## 2012-04-13 DIAGNOSIS — I2510 Atherosclerotic heart disease of native coronary artery without angina pectoris: Secondary | ICD-10-CM | POA: Diagnosis not present

## 2012-04-13 DIAGNOSIS — N400 Enlarged prostate without lower urinary tract symptoms: Secondary | ICD-10-CM | POA: Diagnosis not present

## 2012-04-20 DIAGNOSIS — R7309 Other abnormal glucose: Secondary | ICD-10-CM | POA: Diagnosis not present

## 2012-04-20 DIAGNOSIS — Z125 Encounter for screening for malignant neoplasm of prostate: Secondary | ICD-10-CM | POA: Diagnosis not present

## 2012-05-17 DIAGNOSIS — I2510 Atherosclerotic heart disease of native coronary artery without angina pectoris: Secondary | ICD-10-CM | POA: Diagnosis not present

## 2012-05-17 DIAGNOSIS — F411 Generalized anxiety disorder: Secondary | ICD-10-CM | POA: Diagnosis not present

## 2012-05-17 DIAGNOSIS — Z6833 Body mass index (BMI) 33.0-33.9, adult: Secondary | ICD-10-CM | POA: Diagnosis not present

## 2012-05-17 DIAGNOSIS — G8929 Other chronic pain: Secondary | ICD-10-CM | POA: Diagnosis not present

## 2012-06-13 DIAGNOSIS — L578 Other skin changes due to chronic exposure to nonionizing radiation: Secondary | ICD-10-CM | POA: Diagnosis not present

## 2012-07-03 DIAGNOSIS — Z6833 Body mass index (BMI) 33.0-33.9, adult: Secondary | ICD-10-CM | POA: Diagnosis not present

## 2012-07-03 DIAGNOSIS — N400 Enlarged prostate without lower urinary tract symptoms: Secondary | ICD-10-CM | POA: Diagnosis not present

## 2012-07-03 DIAGNOSIS — R319 Hematuria, unspecified: Secondary | ICD-10-CM | POA: Diagnosis not present

## 2012-07-03 DIAGNOSIS — N390 Urinary tract infection, site not specified: Secondary | ICD-10-CM | POA: Diagnosis not present

## 2012-07-08 DIAGNOSIS — G4700 Insomnia, unspecified: Secondary | ICD-10-CM | POA: Diagnosis not present

## 2012-07-08 DIAGNOSIS — Z6833 Body mass index (BMI) 33.0-33.9, adult: Secondary | ICD-10-CM | POA: Diagnosis not present

## 2012-07-08 DIAGNOSIS — I2510 Atherosclerotic heart disease of native coronary artery without angina pectoris: Secondary | ICD-10-CM | POA: Diagnosis not present

## 2012-08-09 DIAGNOSIS — R5383 Other fatigue: Secondary | ICD-10-CM | POA: Diagnosis not present

## 2012-08-09 DIAGNOSIS — I2510 Atherosclerotic heart disease of native coronary artery without angina pectoris: Secondary | ICD-10-CM | POA: Diagnosis not present

## 2012-08-09 DIAGNOSIS — R5381 Other malaise: Secondary | ICD-10-CM | POA: Diagnosis not present

## 2012-08-09 DIAGNOSIS — G8929 Other chronic pain: Secondary | ICD-10-CM | POA: Diagnosis not present

## 2012-08-09 DIAGNOSIS — Z79899 Other long term (current) drug therapy: Secondary | ICD-10-CM | POA: Diagnosis not present

## 2012-08-10 ENCOUNTER — Encounter: Payer: Self-pay | Admitting: Internal Medicine

## 2012-08-11 ENCOUNTER — Ambulatory Visit: Payer: Medicare Other | Admitting: Urology

## 2012-09-01 DIAGNOSIS — Z6833 Body mass index (BMI) 33.0-33.9, adult: Secondary | ICD-10-CM | POA: Diagnosis not present

## 2012-09-01 DIAGNOSIS — I2510 Atherosclerotic heart disease of native coronary artery without angina pectoris: Secondary | ICD-10-CM | POA: Diagnosis not present

## 2012-09-01 DIAGNOSIS — R1084 Generalized abdominal pain: Secondary | ICD-10-CM | POA: Diagnosis not present

## 2012-09-01 DIAGNOSIS — G8929 Other chronic pain: Secondary | ICD-10-CM | POA: Diagnosis not present

## 2012-09-01 DIAGNOSIS — N400 Enlarged prostate without lower urinary tract symptoms: Secondary | ICD-10-CM | POA: Diagnosis not present

## 2012-09-22 ENCOUNTER — Other Ambulatory Visit (HOSPITAL_COMMUNITY): Payer: Self-pay | Admitting: Internal Medicine

## 2012-09-22 DIAGNOSIS — N400 Enlarged prostate without lower urinary tract symptoms: Secondary | ICD-10-CM | POA: Diagnosis not present

## 2012-09-22 DIAGNOSIS — G8929 Other chronic pain: Secondary | ICD-10-CM | POA: Diagnosis not present

## 2012-09-22 DIAGNOSIS — I2510 Atherosclerotic heart disease of native coronary artery without angina pectoris: Secondary | ICD-10-CM | POA: Diagnosis not present

## 2012-09-22 DIAGNOSIS — Z6832 Body mass index (BMI) 32.0-32.9, adult: Secondary | ICD-10-CM | POA: Diagnosis not present

## 2012-09-22 DIAGNOSIS — K219 Gastro-esophageal reflux disease without esophagitis: Secondary | ICD-10-CM | POA: Diagnosis not present

## 2012-09-26 ENCOUNTER — Ambulatory Visit (HOSPITAL_COMMUNITY): Payer: Medicare Other | Attending: Internal Medicine

## 2012-10-12 ENCOUNTER — Encounter: Payer: Self-pay | Admitting: *Deleted

## 2012-10-12 DIAGNOSIS — G8929 Other chronic pain: Secondary | ICD-10-CM | POA: Insufficient documentation

## 2012-10-12 DIAGNOSIS — I2510 Atherosclerotic heart disease of native coronary artery without angina pectoris: Secondary | ICD-10-CM | POA: Insufficient documentation

## 2012-10-12 DIAGNOSIS — N400 Enlarged prostate without lower urinary tract symptoms: Secondary | ICD-10-CM

## 2012-10-13 ENCOUNTER — Encounter: Payer: Medicare Other | Admitting: Cardiovascular Disease

## 2012-10-13 DIAGNOSIS — F329 Major depressive disorder, single episode, unspecified: Secondary | ICD-10-CM | POA: Diagnosis not present

## 2012-10-13 DIAGNOSIS — Z6833 Body mass index (BMI) 33.0-33.9, adult: Secondary | ICD-10-CM | POA: Diagnosis not present

## 2012-10-13 DIAGNOSIS — F411 Generalized anxiety disorder: Secondary | ICD-10-CM | POA: Diagnosis not present

## 2012-10-13 DIAGNOSIS — G8929 Other chronic pain: Secondary | ICD-10-CM | POA: Diagnosis not present

## 2012-10-13 NOTE — Progress Notes (Signed)
Pt did not come for his appointment ("no show").

## 2012-10-16 ENCOUNTER — Encounter: Payer: Self-pay | Admitting: Cardiovascular Disease

## 2012-10-24 DIAGNOSIS — H353190 Nonexudative age-related macular degeneration, unspecified eye, stage unspecified: Secondary | ICD-10-CM | POA: Diagnosis not present

## 2012-10-27 ENCOUNTER — Other Ambulatory Visit: Payer: Self-pay | Admitting: *Deleted

## 2012-10-27 MED ORDER — CLOPIDOGREL BISULFATE 75 MG PO TABS: 75.0000 mg | ORAL_TABLET | Freq: Every day | ORAL | Status: DC

## 2012-10-27 NOTE — Telephone Encounter (Signed)
Rx was sent to pharmacy electronically. 

## 2012-12-01 ENCOUNTER — Other Ambulatory Visit: Payer: Self-pay | Admitting: Internal Medicine

## 2012-12-05 NOTE — Telephone Encounter (Signed)
Rx was sent to pharmacy electronically. 

## 2012-12-11 ENCOUNTER — Telehealth: Payer: Self-pay | Admitting: Internal Medicine

## 2012-12-11 DIAGNOSIS — E782 Mixed hyperlipidemia: Secondary | ICD-10-CM

## 2012-12-11 DIAGNOSIS — Z79899 Other long term (current) drug therapy: Secondary | ICD-10-CM

## 2012-12-11 NOTE — Telephone Encounter (Signed)
Has an appt this week-usually gets lab work before his appt-Does he need to have it this time?

## 2012-12-11 NOTE — Telephone Encounter (Signed)
York Spaniel he is coming in to see Dr. Rennis Golden on 12/13/12 and bneeds to get his lab work done .Marland Kitchen Would like to get done in  .Marland KitchenMarland KitchenPlease Call   Thanks

## 2012-12-11 NOTE — Telephone Encounter (Signed)
Returned call.  Pt informed lab(s) ordered and to present FASTING to any [facility] to have completed.  Pt verbalized understanding and agreed w/ plan.  Pt has eaten today and will go tomorrow morning fasting.

## 2012-12-12 DIAGNOSIS — E782 Mixed hyperlipidemia: Secondary | ICD-10-CM | POA: Diagnosis not present

## 2012-12-12 DIAGNOSIS — Z79899 Other long term (current) drug therapy: Secondary | ICD-10-CM | POA: Diagnosis not present

## 2012-12-12 LAB — COMPREHENSIVE METABOLIC PANEL
AST: 14 U/L (ref 0–37)
Albumin: 4.1 g/dL (ref 3.5–5.2)
BUN: 17 mg/dL (ref 6–23)
CO2: 28 mEq/L (ref 19–32)
Calcium: 9.2 mg/dL (ref 8.4–10.5)
Chloride: 106 mEq/L (ref 96–112)
Glucose, Bld: 106 mg/dL — ABNORMAL HIGH (ref 70–99)
Potassium: 4.6 mEq/L (ref 3.5–5.3)

## 2012-12-12 LAB — LIPID PANEL: HDL: 44 mg/dL (ref >39)

## 2012-12-12 NOTE — Telephone Encounter (Signed)
Duplicate

## 2012-12-13 ENCOUNTER — Ambulatory Visit (INDEPENDENT_AMBULATORY_CARE_PROVIDER_SITE_OTHER): Payer: Medicare Other | Admitting: Internal Medicine

## 2012-12-13 ENCOUNTER — Encounter: Payer: Self-pay | Admitting: Internal Medicine

## 2012-12-13 ENCOUNTER — Telehealth: Payer: Self-pay | Admitting: Internal Medicine

## 2012-12-13 VITALS — BP 110/80 | HR 74 | Ht 71.0 in | Wt 213.3 lb

## 2012-12-13 DIAGNOSIS — F17200 Nicotine dependence, unspecified, uncomplicated: Secondary | ICD-10-CM | POA: Diagnosis not present

## 2012-12-13 DIAGNOSIS — M545 Low back pain, unspecified: Secondary | ICD-10-CM

## 2012-12-13 DIAGNOSIS — Z951 Presence of aortocoronary bypass graft: Secondary | ICD-10-CM

## 2012-12-13 DIAGNOSIS — R1310 Dysphagia, unspecified: Secondary | ICD-10-CM

## 2012-12-13 DIAGNOSIS — I2510 Atherosclerotic heart disease of native coronary artery without angina pectoris: Secondary | ICD-10-CM

## 2012-12-13 NOTE — Progress Notes (Signed)
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 His main issues are ongoing and seem to be related to GI problems including stomach upset, left upper quadrant pain, difficulty swallowing and dysphagia to solids, as well as reflux symptoms which wake him up coughing at night. He has seen Dr. Karilyn Cota for this; however, he did not perform an endoscopy, rather a colonoscopy and he does take a PPI very infrequently, however. Overall, does not complain of any chest pain, shortness of breath, palpitations, presyncope or syncopal symptoms. He occasional has atypical chest tightness, but this resolved fairly quickly. He has significant anxiety and is frequently asking for additional tests. Recently he asked for laboratory work which was performed prior to his visit. This showed excellent cholesterol profile with total cholesterol 147, triglycerides 103, HDL 44, LDL 82. Renal panel shows preserved renal function and no significant electrolyte abnormalities. There is mild impaired fasting glucose.  PMHx:  Past Medical History  Diagnosis Date  . Hypertension   . CAD (coronary artery disease)   . Insomnia   . Depression   . Colon polyps   . GERD (gastroesophageal reflux disease)   . Dyslipidemia     Past Surgical History  Procedure Laterality Date  . Colonoscopy 10/10/2001 for surveillqance for colonic polyps.      Biopsy revealed hyperplastic polyps. Next colonoscopy in 3 yrs.  . Coronary artery bypass graft  2011    x5   . Appendectomy    . Hernia repair    . Colonoscopy  03/24/2011    Procedure: COLONOSCOPY;  Surgeon: Malissa Hippo, MD;  Location: [facility];  Service: Endoscopy;  Laterality: N/A;  9:30  . Cardiac surgery    . Cardiac  catheterization  04/17/2009    referred for CABG    FAMHx:  Family History  Problem Relation Age of Onset  . Healthy Mother   . Colon cancer Neg Hx     SOCHx:   reports that he quit smoking about 7 years ago. His smoking use included Cigarettes. He has a 20 pack-year smoking history. He has never used smokeless tobacco. He reports that he does not drink alcohol or use illicit drugs.  ALLERGIES:  Allergies  Allergen Reactions  . Claritin [Loratadine]   . Cymbalta [Duloxetine Hcl] Rash    ROS: A comprehensive review of systems was negative except for: Respiratory: positive for dyspnea on exertion Cardiovascular: positive for chest pain  HOME MEDS: Current Outpatient Prescriptions  Medication Sig Dispense Refill  . buPROPion (WELLBUTRIN) 100 MG tablet Take 100 mg by mouth daily.      . clopidogrel (PLAVIX) 75 MG tablet Take 1 tablet (75 mg total) by mouth daily.  30 tablet  3  . CRESTOR 5 MG tablet TAKE ONE TABLET DAILY AT BEDTIME  15 tablet  0  . esomeprazole (NEXIUM) 40 MG capsule Take 40 mg by mouth daily as needed. For acid reflux      . lisinopril (PRINIVIL,ZESTRIL) 5 MG tablet Take 5 mg by mouth daily.       Marland Kitchen LORazepam (ATIVAN) 2 MG tablet Take 2 mg by mouth daily as needed. For anxiety/sleep      . mirtazapine (REMERON) 30 MG tablet Take 30 mg by mouth at bedtime.      Marland Kitchen  traMADol (ULTRAM) 50 MG tablet Take 50 mg by mouth every 6 (six) hours as needed for pain.      Marland Kitchen zolpidem (AMBIEN) 10 MG tablet Take 10 mg by mouth at bedtime as needed for sleep.       No current facility-administered medications for this visit.    LABS/IMAGING: Results for orders placed in visit on 12/11/12 (from the past 48 hour(s))  COMPREHENSIVE METABOLIC PANEL     Status: Abnormal   Collection Time    12/12/12  8:22 AM      Result Value Range   Sodium 138  135 - 145 mEq/L   Potassium 4.6  3.5 - 5.3 mEq/L   Chloride 106  96 - 112 mEq/L   CO2 28  19 - 32 mEq/L   Glucose, Bld 106 (*) 70 -  99 mg/dL   BUN 17  6 - 23 mg/dL   Creat 1.61  0.96 - 0.45 mg/dL   Total Bilirubin 0.6  0.3 - 1.2 mg/dL   Alkaline Phosphatase 72  39 - 117 U/L   AST 14  0 - 37 U/L   ALT 15  0 - 53 U/L   Total Protein 6.7  6.0 - 8.3 g/dL   Albumin 4.1  3.5 - 5.2 g/dL   Calcium 9.2  8.4 - 40.9 mg/dL  LIPID PANEL     Status: None   Collection Time    12/12/12  8:22 AM      Result Value Range   Cholesterol 147  0 - 200 mg/dL   Comment: ATP III Classification:           < 200        mg/dL        Desirable          200 - 239     mg/dL        Borderline High          >= 240        mg/dL        High         Triglycerides 103  <150 mg/dL   HDL 44  >81 mg/dL   Total CHOL/HDL Ratio 3.3     VLDL 21  0 - 40 mg/dL   LDL Cholesterol 82  0 - 99 mg/dL   Comment:       Total Cholesterol/HDL Ratio:CHD Risk                            Coronary Heart Disease Risk Table                                            Men       Women              1/2 Average Risk              3.4        3.3                  Average Risk              5.0        4.4               2X Average Risk  9.6        7.1               3X Average Risk             23.4       11.0     Use the calculated Patient Ratio above and the CHD Risk table      to determine the patient's CHD Risk.     ATP III Classification (LDL):           < 100        mg/dL         Optimal          100 - 129     mg/dL         Near or Above Optimal          130 - 159     mg/dL         Borderline High          160 - 189     mg/dL         High           > 190        mg/dL         Very High         No results found.  VITALS: BP 110/80  Pulse 74  Ht 5\' 11"  (1.803 m)  Wt 213 lb 4.8 oz (96.752 kg)  BMI 29.76 kg/m2  EXAM: General appearance: alert and no distress Neck: no adenopathy, no carotid bruit, no JVD, supple, symmetrical, trachea midline and thyroid not enlarged, symmetric, no tenderness/mass/nodules Lungs: clear to auscultation bilaterally Heart:  regular rate and rhythm, S1, S2 normal, no murmur, click, rub or gallop Abdomen: soft, non-tender; bowel sounds normal; no masses,  no organomegaly Extremities: extremities normal, atraumatic, no cyanosis or edema Pulses: 2+ and symmetric Skin: Skin color, texture, turgor normal. No rashes or lesions Neurologic: Grossly normal  EKG: Normal sinus rhythm at 74  ASSESSMENT: 1. Coronary disease status post 5 vessel CABG in 2011 2. Hypertension-controlled 3. Dyslipidemia-at goal 4. Anxiety  PLAN: 1.   Mr. Lemmons continues to have a number of somatic complaints but no concerning anginal chest pain or pattern of worsening symptoms concerning for problems with his coronary bypass grafts. We'll see him back in a year or sooner as necessary. He does have carotid Dopplers ordered possibly as a followup from an earlier study.  Chrystie Nose, MD, [facility] Attending Cardiologist [facility]  Rhylen Pulido C 12/13/2012, 3:24 PM

## 2012-12-13 NOTE — Patient Instructions (Addendum)
Your physician wants you to follow-up in: 1 year. You will receive a reminder letter in the mail two months in advance. If you don't receive a letter, please call our office to schedule the follow-up appointment.  

## 2012-12-13 NOTE — Telephone Encounter (Signed)
Wants to know if you received his lab results from [facility]-had lab work yesterday?Need to know before he comes over here.

## 2012-12-13 NOTE — Telephone Encounter (Signed)
Returned call.  No answer/voicemail.   Please inform pt labs received.

## 2012-12-15 DIAGNOSIS — G589 Mononeuropathy, unspecified: Secondary | ICD-10-CM | POA: Diagnosis not present

## 2012-12-15 DIAGNOSIS — N400 Enlarged prostate without lower urinary tract symptoms: Secondary | ICD-10-CM | POA: Diagnosis not present

## 2012-12-15 DIAGNOSIS — Z6833 Body mass index (BMI) 33.0-33.9, adult: Secondary | ICD-10-CM | POA: Diagnosis not present

## 2012-12-15 DIAGNOSIS — I2510 Atherosclerotic heart disease of native coronary artery without angina pectoris: Secondary | ICD-10-CM | POA: Diagnosis not present

## 2012-12-15 DIAGNOSIS — Z23 Encounter for immunization: Secondary | ICD-10-CM | POA: Diagnosis not present

## 2012-12-19 ENCOUNTER — Telehealth: Payer: Self-pay | Admitting: Internal Medicine

## 2012-12-19 MED ORDER — ROSUVASTATIN CALCIUM 5 MG PO TABS: 5.0000 mg | ORAL_TABLET | Freq: Every day | ORAL | Status: DC

## 2012-12-19 NOTE — Telephone Encounter (Signed)
Returned call.  Pt informed message received and no changes made to medicines.  Pt verbalized understanding and agreed w/ plan.  Pt needed refill on Crestor 5 mg.  Refill(s) sent to pharmacy.

## 2012-12-19 NOTE — Telephone Encounter (Signed)
Patient was seen recently by Dr. Rennis Golden and patient left without his paperwork.  Did we prescribe any refills or new medications?

## 2012-12-23 DIAGNOSIS — R141 Gas pain: Secondary | ICD-10-CM | POA: Diagnosis not present

## 2012-12-23 DIAGNOSIS — I2510 Atherosclerotic heart disease of native coronary artery without angina pectoris: Secondary | ICD-10-CM | POA: Diagnosis not present

## 2012-12-23 DIAGNOSIS — G8929 Other chronic pain: Secondary | ICD-10-CM | POA: Diagnosis not present

## 2012-12-23 DIAGNOSIS — Z6833 Body mass index (BMI) 33.0-33.9, adult: Secondary | ICD-10-CM | POA: Diagnosis not present

## 2012-12-25 ENCOUNTER — Other Ambulatory Visit (HOSPITAL_COMMUNITY): Payer: Self-pay | Admitting: Internal Medicine

## 2012-12-25 DIAGNOSIS — R0609 Other forms of dyspnea: Secondary | ICD-10-CM

## 2012-12-25 DIAGNOSIS — R109 Unspecified abdominal pain: Secondary | ICD-10-CM

## 2012-12-27 ENCOUNTER — Ambulatory Visit (HOSPITAL_COMMUNITY)
Admission: RE | Admit: 2012-12-27 | Discharge: 2012-12-27 | Disposition: A | Discharge status: Home / Self Care | Payer: Medicare Other | Source: Ambulatory Visit | Attending: Internal Medicine | Admitting: Internal Medicine

## 2012-12-27 DIAGNOSIS — R0989 Other specified symptoms and signs involving the circulatory and respiratory systems: Secondary | ICD-10-CM | POA: Insufficient documentation

## 2012-12-27 DIAGNOSIS — I6529 Occlusion and stenosis of unspecified carotid artery: Secondary | ICD-10-CM | POA: Diagnosis not present

## 2012-12-27 DIAGNOSIS — K219 Gastro-esophageal reflux disease without esophagitis: Secondary | ICD-10-CM | POA: Diagnosis not present

## 2012-12-27 DIAGNOSIS — I658 Occlusion and stenosis of other precerebral arteries: Secondary | ICD-10-CM | POA: Insufficient documentation

## 2012-12-27 DIAGNOSIS — Z951 Presence of aortocoronary bypass graft: Secondary | ICD-10-CM | POA: Diagnosis not present

## 2012-12-27 DIAGNOSIS — R0609 Other forms of dyspnea: Secondary | ICD-10-CM | POA: Insufficient documentation

## 2012-12-27 DIAGNOSIS — I2510 Atherosclerotic heart disease of native coronary artery without angina pectoris: Secondary | ICD-10-CM | POA: Diagnosis not present

## 2012-12-27 DIAGNOSIS — I723 Aneurysm of iliac artery: Secondary | ICD-10-CM | POA: Insufficient documentation

## 2012-12-27 DIAGNOSIS — I10 Essential (primary) hypertension: Secondary | ICD-10-CM | POA: Diagnosis not present

## 2012-12-27 DIAGNOSIS — K439 Ventral hernia without obstruction or gangrene: Secondary | ICD-10-CM | POA: Diagnosis not present

## 2012-12-27 DIAGNOSIS — R109 Unspecified abdominal pain: Secondary | ICD-10-CM

## 2012-12-27 MED ORDER — IOHEXOL 300 MG/ML  SOLN
100.0000 mL | Freq: Once | INTRAMUSCULAR | Status: AC | PRN
  Administered 2012-12-27: 100 mL via INTRAVENOUS

## 2013-01-15 DIAGNOSIS — G8929 Other chronic pain: Secondary | ICD-10-CM | POA: Diagnosis not present

## 2013-01-15 DIAGNOSIS — F411 Generalized anxiety disorder: Secondary | ICD-10-CM | POA: Diagnosis not present

## 2013-01-15 DIAGNOSIS — Z6833 Body mass index (BMI) 33.0-33.9, adult: Secondary | ICD-10-CM | POA: Diagnosis not present

## 2013-02-10 DIAGNOSIS — N400 Enlarged prostate without lower urinary tract symptoms: Secondary | ICD-10-CM | POA: Diagnosis not present

## 2013-02-10 DIAGNOSIS — I2510 Atherosclerotic heart disease of native coronary artery without angina pectoris: Secondary | ICD-10-CM | POA: Diagnosis not present

## 2013-02-10 DIAGNOSIS — R319 Hematuria, unspecified: Secondary | ICD-10-CM | POA: Diagnosis not present

## 2013-02-10 DIAGNOSIS — Z6832 Body mass index (BMI) 32.0-32.9, adult: Secondary | ICD-10-CM | POA: Diagnosis not present

## 2013-02-12 DIAGNOSIS — F329 Major depressive disorder, single episode, unspecified: Secondary | ICD-10-CM | POA: Diagnosis not present

## 2013-02-12 DIAGNOSIS — G8929 Other chronic pain: Secondary | ICD-10-CM | POA: Diagnosis not present

## 2013-02-12 DIAGNOSIS — Z6832 Body mass index (BMI) 32.0-32.9, adult: Secondary | ICD-10-CM | POA: Diagnosis not present

## 2013-03-05 ENCOUNTER — Other Ambulatory Visit: Payer: Self-pay | Admitting: Internal Medicine

## 2013-03-05 NOTE — Telephone Encounter (Signed)
Rx was sent to pharmacy electronically. 

## 2013-03-06 DIAGNOSIS — Z6832 Body mass index (BMI) 32.0-32.9, adult: Secondary | ICD-10-CM | POA: Diagnosis not present

## 2013-03-06 DIAGNOSIS — I2510 Atherosclerotic heart disease of native coronary artery without angina pectoris: Secondary | ICD-10-CM | POA: Diagnosis not present

## 2013-04-02 DIAGNOSIS — H35379 Puckering of macula, unspecified eye: Secondary | ICD-10-CM | POA: Diagnosis not present

## 2013-04-02 DIAGNOSIS — H40019 Open angle with borderline findings, low risk, unspecified eye: Secondary | ICD-10-CM | POA: Diagnosis not present

## 2013-04-02 DIAGNOSIS — H35369 Drusen (degenerative) of macula, unspecified eye: Secondary | ICD-10-CM | POA: Diagnosis not present

## 2013-04-02 DIAGNOSIS — H2589 Other age-related cataract: Secondary | ICD-10-CM | POA: Diagnosis not present

## 2013-04-13 DIAGNOSIS — I2510 Atherosclerotic heart disease of native coronary artery without angina pectoris: Secondary | ICD-10-CM | POA: Diagnosis not present

## 2013-04-13 DIAGNOSIS — Z6832 Body mass index (BMI) 32.0-32.9, adult: Secondary | ICD-10-CM | POA: Diagnosis not present

## 2013-04-13 DIAGNOSIS — K219 Gastro-esophageal reflux disease without esophagitis: Secondary | ICD-10-CM | POA: Diagnosis not present

## 2013-04-13 DIAGNOSIS — R3129 Other microscopic hematuria: Secondary | ICD-10-CM | POA: Diagnosis not present

## 2013-04-13 DIAGNOSIS — R198 Other specified symptoms and signs involving the digestive system and abdomen: Secondary | ICD-10-CM | POA: Diagnosis not present

## 2013-04-17 ENCOUNTER — Encounter (HOSPITAL_COMMUNITY): Payer: Self-pay

## 2013-04-23 NOTE — Patient Instructions (Signed)
Your procedure is scheduled on:  04/30/2013  Report to [facility] at  7:00    AM.  Call this number if you have problems the morning of surgery: [phone]   Remember:   Do not eat or drink :After Midnight.    Take these medicines the morning of surgery with A SIP OF WATER: Lisinopril, Bupropion and nexium    Do not wear jewelry, make-up or nail polish.  Do not wear lotions, powders, or perfumes. You may wear deodorant.  Do not shave 48 hours prior to surgery.  Do not bring valuables to the hospital.  Contacts, dentures or bridgework may not be worn into surgery.  Patients discharged the day of surgery will not be allowed to drive home.  Name and phone number of your driver:    Please read over the following fact sheets that you were given: Pain Booklet, Surgical Site Infection Prevention, Anesthesia Post-op Instructions and Care and Recovery After Surgery  Cataract Surgery  A cataract is a clouding of the lens of the eye. When a lens becomes cloudy, vision is reduced based on the degree and nature of the clouding. Surgery may be needed to improve vision. Surgery removes the cloudy lens and usually replaces it with a substitute lens (intraocular lens, IOL). LET YOUR EYE DOCTOR KNOW ABOUT:  Allergies to food or medicine.   Medicines taken including herbs, eyedrops, over-the-counter medicines, and creams.   Use of steroids (by mouth or creams).   Previous problems with anesthetics or numbing medicine.   History of bleeding problems or blood clots.   Previous surgery.   Other health problems, including diabetes and kidney problems.   Possibility of pregnancy, if this applies.  RISKS AND COMPLICATIONS  Infection.   Inflammation of the eyeball (endophthalmitis) that can spread to both eyes (sympathetic ophthalmia).   Poor wound healing.   If an IOL is inserted, it can later fall out of proper position. This is very uncommon.   Clouding of the part of your eye that holds  an IOL in place. This is called an "after-cataract." These are uncommon, but easily treated.  BEFORE THE PROCEDURE  Do not eat or drink anything except small amounts of water for 8 to 12 before your surgery, or as directed by your caregiver.   Unless you are told otherwise, continue any eyedrops you have been prescribed.   Talk to your primary caregiver about all other medicines that you take (both prescription and non-prescription). In some cases, you may need to stop or change medicines near the time of your surgery. This is most important if you are taking blood-thinning medicine.Do not stop medicines unless you are told to do so.   Arrange for someone to drive you to and from the procedure.   Do not put contact lenses in either eye on the day of your surgery.  PROCEDURE There is more than one method for safely removing a cataract. Your doctor can explain the differences and help determine which is best for you. Phacoemulsification surgery is the most common form of cataract surgery.  An injection is given behind the eye or eyedrops are given to make this a painless procedure.   A small cut (incision) is made on the edge of the clear, dome-shaped surface that covers the front of the eye (cornea).   A tiny probe is painlessly inserted into the eye. This device gives off ultrasound waves that soften and break up the cloudy center of the lens. This  makes it easier for the cloudy lens to be removed by suction.   An IOL may be implanted.   The normal lens of the eye is covered by a clear capsule. Part of that capsule is intentionally left in the eye to support the IOL.   Your surgeon may or may not use stitches to close the incision.  There are other forms of cataract surgery that require a larger incision and stiches to close the eye. This approach is taken in cases where the doctor feels that the cataract cannot be easily removed using phacoemulsification. AFTER THE PROCEDURE  When an  IOL is implanted, it does not need care. It becomes a permanent part of your eye and cannot be seen or felt.   Your doctor will schedule follow-up exams to check on your progress.   Review your other medicines with your doctor to see which can be resumed after surgery.   Use eyedrops or take medicine as prescribed by your doctor.  Document Released: 03/11/2011 Document Reviewed: 03/08/2011 [facility] Patient Information 2012 [place].  .Cataract Surgery Care After Refer to this sheet in the next few weeks. These instructions provide you with information on caring for yourself after your procedure. Your caregiver may also give you more specific instructions. Your treatment has been planned according to current medical practices, but problems sometimes occur. Call your caregiver if you have any problems or questions after your procedure.  HOME CARE INSTRUCTIONS   Avoid strenuous activities as directed by your caregiver.   Ask your caregiver when you can resume driving.   Use eyedrops or other medicines to help healing and control pressure inside your eye as directed by your caregiver.   Only take over-the-counter or prescription medicines for pain, discomfort, or fever as directed by your caregiver.   Do not to touch or rub your eyes.   You may be instructed to use a protective shield during the first few days and nights after surgery. If not, wear sunglasses to protect your eyes. This is to protect the eye from pressure or from being accidentally bumped.   Keep the area around your eye clean and dry. Avoid swimming or allowing water to hit you directly in the face while showering. Keep soap and shampoo out of your eyes.   Do not bend or lift heavy objects. Bending increases pressure in the eye. You can walk, climb stairs, and do light household chores.   Do not put a contact lens into the eye that had surgery until your caregiver says it is okay to do so.   Ask your doctor when  you can return to work. This will depend on the kind of work that you do. If you work in a dusty environment, you may be advised to wear protective eyewear for a period of time.   Ask your caregiver when it will be safe to engage in sexual activity.   Continue with your regular eye exams as directed by your caregiver.  What to expect:  It is normal to feel itching and mild discomfort for a few days after cataract surgery. Some fluid discharge is also common, and your eye may be sensitive to light and touch.   After 1 to 2 days, even moderate discomfort should disappear. In most cases, healing will take about 6 weeks.   If you received an intraocular lens (IOL), you may notice that colors are very bright or have a blue tinge. Also, if you have been in bright  sunlight, everything may appear reddish for a few hours. If you see these color tinges, it is because your lens is clear and no longer cloudy. Within a few months after receiving an IOL, these extra colors should go away. When you have healed, you will probably need new glasses.  SEEK MEDICAL CARE IF:   You have increased bruising around your eye.   You have discomfort not helped by medicine.  SEEK IMMEDIATE MEDICAL CARE IF:   You have a fever.   You have a worsening or sudden vision loss.   You have redness, swelling, or increasing pain in the eye.   You have a thick discharge from the eye that had surgery.  MAKE SURE YOU:  Understand these instructions.   Will watch your condition.   Will get help right away if you are not doing well or get worse.  Document Released: 10/09/2004 Document Revised: 03/11/2011 Document Reviewed: 11/13/2010 [facility] Patient Information 2012 [place].

## 2013-04-24 ENCOUNTER — Encounter (HOSPITAL_COMMUNITY): Payer: Self-pay

## 2013-04-24 ENCOUNTER — Encounter (HOSPITAL_COMMUNITY)
Admission: RE | Admit: 2013-04-24 | Discharge: 2013-04-24 | Disposition: A | Discharge status: Home / Self Care | Payer: Medicare Other | Source: Ambulatory Visit | Attending: Ophthalmology | Admitting: Ophthalmology

## 2013-04-24 DIAGNOSIS — Z01812 Encounter for preprocedural laboratory examination: Secondary | ICD-10-CM | POA: Insufficient documentation

## 2013-04-24 DIAGNOSIS — Z01818 Encounter for other preprocedural examination: Secondary | ICD-10-CM | POA: Diagnosis not present

## 2013-04-24 HISTORY — DX: Acute myocardial infarction, unspecified: I21.9

## 2013-04-24 HISTORY — DX: Unspecified osteoarthritis, unspecified site: M19.90

## 2013-04-24 LAB — BASIC METABOLIC PANEL
BUN: 13 mg/dL (ref 6–23)
CO2: 24 meq/L (ref 19–32)
CREATININE: 0.88 mg/dL (ref 0.50–1.35)
Calcium: 9.6 mg/dL (ref 8.4–10.5)
Chloride: 102 mEq/L (ref 96–112)
GFR calc Af Amer: >90 mL/min (ref >90)
GFR, EST NON AFRICAN AMERICAN: 84 mL/min — AB (ref >90)
Glucose, Bld: 107 mg/dL — ABNORMAL HIGH (ref 70–99)
Potassium: 4.6 mEq/L (ref 3.7–5.3)
Sodium: 137 mEq/L (ref 137–147)

## 2013-04-24 LAB — HEMOGLOBIN AND HEMATOCRIT, BLOOD
HCT: 44.6 % (ref 39.0–52.0)
Hemoglobin: 15.4 g/dL (ref 13.0–17.0)

## 2013-04-25 ENCOUNTER — Encounter (INDEPENDENT_AMBULATORY_CARE_PROVIDER_SITE_OTHER): Payer: Self-pay | Admitting: *Deleted

## 2013-04-27 MED ORDER — LIDOCAINE HCL (PF) 1 % IJ SOLN
INTRAMUSCULAR | Status: AC
  Filled 2013-04-27: qty 2

## 2013-04-27 MED ORDER — PHENYLEPHRINE HCL 2.5 % OP SOLN
OPHTHALMIC | Status: AC
  Filled 2013-04-27: qty 15

## 2013-04-27 MED ORDER — TETRACAINE HCL 0.5 % OP SOLN
OPHTHALMIC | Status: AC
  Filled 2013-04-27: qty 2

## 2013-04-27 MED ORDER — LIDOCAINE HCL 3.5 % OP GEL
OPHTHALMIC | Status: AC
  Filled 2013-04-27: qty 1

## 2013-04-27 MED ORDER — NEOMYCIN-POLYMYXIN-DEXAMETH 3.5-10000-0.1 OP SUSP
OPHTHALMIC | Status: AC
  Filled 2013-04-27: qty 5

## 2013-04-27 MED ORDER — CYCLOPENTOLATE-PHENYLEPHRINE OP SOLN OPTIME - NO CHARGE
OPHTHALMIC | Status: AC
  Filled 2013-04-27: qty 2

## 2013-04-30 ENCOUNTER — Encounter (HOSPITAL_COMMUNITY): Admission: RE | Payer: Self-pay | Source: Ambulatory Visit

## 2013-04-30 ENCOUNTER — Ambulatory Visit (HOSPITAL_COMMUNITY): Admission: RE | Admit: 2013-04-30 | Payer: Medicare Other | Source: Ambulatory Visit | Admitting: Ophthalmology

## 2013-04-30 SURGERY — PHACOEMULSIFICATION, CATARACT, WITH IOL INSERTION
Anesthesia: Monitor Anesthesia Care | Site: Eye | Laterality: Right

## 2013-05-02 DIAGNOSIS — I2510 Atherosclerotic heart disease of native coronary artery without angina pectoris: Secondary | ICD-10-CM | POA: Diagnosis not present

## 2013-05-02 DIAGNOSIS — Z6832 Body mass index (BMI) 32.0-32.9, adult: Secondary | ICD-10-CM | POA: Diagnosis not present

## 2013-05-02 DIAGNOSIS — F411 Generalized anxiety disorder: Secondary | ICD-10-CM | POA: Diagnosis not present

## 2013-05-02 DIAGNOSIS — G8929 Other chronic pain: Secondary | ICD-10-CM | POA: Diagnosis not present

## 2013-05-03 ENCOUNTER — Ambulatory Visit (INDEPENDENT_AMBULATORY_CARE_PROVIDER_SITE_OTHER): Payer: Medicare Other | Admitting: Internal Medicine

## 2013-05-04 ENCOUNTER — Ambulatory Visit (INDEPENDENT_AMBULATORY_CARE_PROVIDER_SITE_OTHER): Payer: Medicare Other | Admitting: Internal Medicine

## 2013-05-08 DIAGNOSIS — L851 Acquired keratosis [keratoderma] palmaris et plantaris: Secondary | ICD-10-CM | POA: Diagnosis not present

## 2013-05-08 DIAGNOSIS — L820 Inflamed seborrheic keratosis: Secondary | ICD-10-CM | POA: Diagnosis not present

## 2013-05-16 DIAGNOSIS — Z6832 Body mass index (BMI) 32.0-32.9, adult: Secondary | ICD-10-CM | POA: Diagnosis not present

## 2013-05-16 DIAGNOSIS — G8929 Other chronic pain: Secondary | ICD-10-CM | POA: Diagnosis not present

## 2013-05-16 DIAGNOSIS — N400 Enlarged prostate without lower urinary tract symptoms: Secondary | ICD-10-CM | POA: Diagnosis not present

## 2013-05-16 DIAGNOSIS — K219 Gastro-esophageal reflux disease without esophagitis: Secondary | ICD-10-CM | POA: Diagnosis not present

## 2013-05-16 DIAGNOSIS — R3129 Other microscopic hematuria: Secondary | ICD-10-CM | POA: Diagnosis not present

## 2013-05-21 ENCOUNTER — Encounter (INDEPENDENT_AMBULATORY_CARE_PROVIDER_SITE_OTHER): Payer: Self-pay | Admitting: Internal Medicine

## 2013-05-21 ENCOUNTER — Encounter (INDEPENDENT_AMBULATORY_CARE_PROVIDER_SITE_OTHER): Payer: Medicare Other | Admitting: Internal Medicine

## 2013-05-23 NOTE — Progress Notes (Signed)
Error

## 2013-05-24 ENCOUNTER — Ambulatory Visit (INDEPENDENT_AMBULATORY_CARE_PROVIDER_SITE_OTHER): Payer: Medicare Other | Admitting: Internal Medicine

## 2013-05-28 DIAGNOSIS — Z6831 Body mass index (BMI) 31.0-31.9, adult: Secondary | ICD-10-CM | POA: Diagnosis not present

## 2013-05-28 DIAGNOSIS — G8929 Other chronic pain: Secondary | ICD-10-CM | POA: Diagnosis not present

## 2013-06-29 DIAGNOSIS — G8929 Other chronic pain: Secondary | ICD-10-CM | POA: Diagnosis not present

## 2013-06-29 DIAGNOSIS — K219 Gastro-esophageal reflux disease without esophagitis: Secondary | ICD-10-CM | POA: Diagnosis not present

## 2013-06-29 DIAGNOSIS — M2550 Pain in unspecified joint: Secondary | ICD-10-CM | POA: Diagnosis not present

## 2013-06-29 DIAGNOSIS — I2510 Atherosclerotic heart disease of native coronary artery without angina pectoris: Secondary | ICD-10-CM | POA: Diagnosis not present

## 2013-06-29 DIAGNOSIS — Z6832 Body mass index (BMI) 32.0-32.9, adult: Secondary | ICD-10-CM | POA: Diagnosis not present

## 2013-07-27 DIAGNOSIS — I2510 Atherosclerotic heart disease of native coronary artery without angina pectoris: Secondary | ICD-10-CM | POA: Diagnosis not present

## 2013-07-27 DIAGNOSIS — C61 Malignant neoplasm of prostate: Secondary | ICD-10-CM | POA: Diagnosis not present

## 2013-07-27 DIAGNOSIS — Z6832 Body mass index (BMI) 32.0-32.9, adult: Secondary | ICD-10-CM | POA: Diagnosis not present

## 2013-07-27 DIAGNOSIS — Z125 Encounter for screening for malignant neoplasm of prostate: Secondary | ICD-10-CM | POA: Diagnosis not present

## 2013-07-27 DIAGNOSIS — Z79899 Other long term (current) drug therapy: Secondary | ICD-10-CM | POA: Diagnosis not present

## 2013-07-27 DIAGNOSIS — E785 Hyperlipidemia, unspecified: Secondary | ICD-10-CM | POA: Diagnosis not present

## 2013-07-27 DIAGNOSIS — I10 Essential (primary) hypertension: Secondary | ICD-10-CM | POA: Diagnosis not present

## 2013-08-03 ENCOUNTER — Other Ambulatory Visit (HOSPITAL_COMMUNITY): Payer: Self-pay | Admitting: Internal Medicine

## 2013-08-03 ENCOUNTER — Ambulatory Visit (HOSPITAL_COMMUNITY)
Admission: RE | Admit: 2013-08-03 | Discharge: 2013-08-03 | Disposition: A | Discharge status: Home / Self Care | Payer: Medicare Other | Source: Ambulatory Visit | Attending: Internal Medicine | Admitting: Internal Medicine

## 2013-08-03 DIAGNOSIS — Z951 Presence of aortocoronary bypass graft: Secondary | ICD-10-CM | POA: Insufficient documentation

## 2013-08-03 DIAGNOSIS — Z6832 Body mass index (BMI) 32.0-32.9, adult: Secondary | ICD-10-CM | POA: Diagnosis not present

## 2013-08-03 DIAGNOSIS — R0609 Other forms of dyspnea: Secondary | ICD-10-CM

## 2013-08-03 DIAGNOSIS — R0602 Shortness of breath: Secondary | ICD-10-CM | POA: Diagnosis not present

## 2013-08-03 DIAGNOSIS — R0989 Other specified symptoms and signs involving the circulatory and respiratory systems: Secondary | ICD-10-CM

## 2013-08-03 DIAGNOSIS — K219 Gastro-esophageal reflux disease without esophagitis: Secondary | ICD-10-CM | POA: Diagnosis not present

## 2013-08-03 DIAGNOSIS — I2510 Atherosclerotic heart disease of native coronary artery without angina pectoris: Secondary | ICD-10-CM | POA: Diagnosis not present

## 2013-08-06 DIAGNOSIS — H40019 Open angle with borderline findings, low risk, unspecified eye: Secondary | ICD-10-CM | POA: Diagnosis not present

## 2013-08-06 DIAGNOSIS — H2589 Other age-related cataract: Secondary | ICD-10-CM | POA: Diagnosis not present

## 2013-08-06 DIAGNOSIS — H35379 Puckering of macula, unspecified eye: Secondary | ICD-10-CM | POA: Diagnosis not present

## 2013-08-07 ENCOUNTER — Encounter (HOSPITAL_COMMUNITY): Payer: Self-pay | Admitting: Pharmacy Technician

## 2013-08-07 NOTE — Patient Instructions (Signed)
Your procedure is scheduled on:  08/13/13  Report to [facility] at  7:30    AM.  Call this number if you have problems the morning of surgery: [phone]   Remember:   Do not eat or drink :After Midnight.    Take these medicines the morning of surgery with A SIP OF WATER: Lisinopril, Nexium and Wellbutin   Do not wear jewelry, make-up or nail polish.  Do not wear lotions, powders, or perfumes. You may wear deodorant.  Do not shave 48 hours prior to surgery.  Do not bring valuables to the hospital.  Contacts, dentures or bridgework may not be worn into surgery.  Patients discharged the day of surgery will not be allowed to drive home.  Name and phone number of your driver:    Please read over the following fact sheets that you were given: Pain Booklet, Surgical Site Infection Prevention, Anesthesia Post-op Instructions and Care and Recovery After Surgery  Cataract Surgery  A cataract is a clouding of the lens of the eye. When a lens becomes cloudy, vision is reduced based on the degree and nature of the clouding. Surgery may be needed to improve vision. Surgery removes the cloudy lens and usually replaces it with a substitute lens (intraocular lens, IOL). LET YOUR EYE DOCTOR KNOW ABOUT:  Allergies to food or medicine.   Medicines taken including herbs, eyedrops, over-the-counter medicines, and creams.   Use of steroids (by mouth or creams).   Previous problems with anesthetics or numbing medicine.   History of bleeding problems or blood clots.   Previous surgery.   Other health problems, including diabetes and kidney problems.   Possibility of pregnancy, if this applies.  RISKS AND COMPLICATIONS  Infection.   Inflammation of the eyeball (endophthalmitis) that can spread to both eyes (sympathetic ophthalmia).   Poor wound healing.   If an IOL is inserted, it can later fall out of proper position. This is very uncommon.   Clouding of the part of your eye that holds an  IOL in place. This is called an "after-cataract." These are uncommon, but easily treated.  BEFORE THE PROCEDURE  Do not eat or drink anything except small amounts of water for 8 to 12 before your surgery, or as directed by your caregiver.   Unless you are told otherwise, continue any eyedrops you have been prescribed.   Talk to your primary caregiver about all other medicines that you take (both prescription and non-prescription). In some cases, you may need to stop or change medicines near the time of your surgery. This is most important if you are taking blood-thinning medicine.Do not stop medicines unless you are told to do so.   Arrange for someone to drive you to and from the procedure.   Do not put contact lenses in either eye on the day of your surgery.  PROCEDURE There is more than one method for safely removing a cataract. Your doctor can explain the differences and help determine which is best for you. Phacoemulsification surgery is the most common form of cataract surgery.  An injection is given behind the eye or eyedrops are given to make this a painless procedure.   A small cut (incision) is made on the edge of the clear, dome-shaped surface that covers the front of the eye (cornea).   A tiny probe is painlessly inserted into the eye. This device gives off ultrasound waves that soften and break up the cloudy center of the lens. This makes  it easier for the cloudy lens to be removed by suction.   An IOL may be implanted.   The normal lens of the eye is covered by a clear capsule. Part of that capsule is intentionally left in the eye to support the IOL.   Your surgeon may or may not use stitches to close the incision.  There are other forms of cataract surgery that require a larger incision and stiches to close the eye. This approach is taken in cases where the doctor feels that the cataract cannot be easily removed using phacoemulsification. AFTER THE PROCEDURE  When an  IOL is implanted, it does not need care. It becomes a permanent part of your eye and cannot be seen or felt.   Your doctor will schedule follow-up exams to check on your progress.   Review your other medicines with your doctor to see which can be resumed after surgery.   Use eyedrops or take medicine as prescribed by your doctor.  Document Released: 03/11/2011 Document Reviewed: 03/08/2011 [facility] Patient Information 2012 [place].  .Cataract Surgery Care After Refer to this sheet in the next few weeks. These instructions provide you with information on caring for yourself after your procedure. Your caregiver may also give you more specific instructions. Your treatment has been planned according to current medical practices, but problems sometimes occur. Call your caregiver if you have any problems or questions after your procedure.  HOME CARE INSTRUCTIONS   Avoid strenuous activities as directed by your caregiver.   Ask your caregiver when you can resume driving.   Use eyedrops or other medicines to help healing and control pressure inside your eye as directed by your caregiver.   Only take over-the-counter or prescription medicines for pain, discomfort, or fever as directed by your caregiver.   Do not to touch or rub your eyes.   You may be instructed to use a protective shield during the first few days and nights after surgery. If not, wear sunglasses to protect your eyes. This is to protect the eye from pressure or from being accidentally bumped.   Keep the area around your eye clean and dry. Avoid swimming or allowing water to hit you directly in the face while showering. Keep soap and shampoo out of your eyes.   Do not bend or lift heavy objects. Bending increases pressure in the eye. You can walk, climb stairs, and do light household chores.   Do not put a contact lens into the eye that had surgery until your caregiver says it is okay to do so.   Ask your doctor when  you can return to work. This will depend on the kind of work that you do. If you work in a dusty environment, you may be advised to wear protective eyewear for a period of time.   Ask your caregiver when it will be safe to engage in sexual activity.   Continue with your regular eye exams as directed by your caregiver.  What to expect:  It is normal to feel itching and mild discomfort for a few days after cataract surgery. Some fluid discharge is also common, and your eye may be sensitive to light and touch.   After 1 to 2 days, even moderate discomfort should disappear. In most cases, healing will take about 6 weeks.   If you received an intraocular lens (IOL), you may notice that colors are very bright or have a blue tinge. Also, if you have been in bright sunlight,  everything may appear reddish for a few hours. If you see these color tinges, it is because your lens is clear and no longer cloudy. Within a few months after receiving an IOL, these extra colors should go away. When you have healed, you will probably need new glasses.  SEEK MEDICAL CARE IF:   You have increased bruising around your eye.   You have discomfort not helped by medicine.  SEEK IMMEDIATE MEDICAL CARE IF:   You have a fever.   You have a worsening or sudden vision loss.   You have redness, swelling, or increasing pain in the eye.   You have a thick discharge from the eye that had surgery.  MAKE SURE YOU:  Understand these instructions.   Will watch your condition.   Will get help right away if you are not doing well or get worse.  Document Released: 10/09/2004 Document Revised: 03/11/2011 Document Reviewed: 11/13/2010 [facility] Patient Information 2012 [place].    Monitored Anesthesia Care  Monitored anesthesia care is an anesthesia service for a medical procedure. Anesthesia is the loss of the ability to feel pain. It is produced by medications called anesthetics. It may affect a small area of  your body (local anesthesia), a large area of your body (regional anesthesia), or your entire body (general anesthesia). The need for monitored anesthesia care depends your procedure, your condition, and the potential need for regional or general anesthesia. It is often provided during procedures where:   General anesthesia may be needed if there are complications. This is because you need special care when you are under general anesthesia.   You will be under local or regional anesthesia. This is so that you are able to have higher levels of anesthesia if needed.   You will receive calming medications (sedatives). This is especially the case if sedatives are given to put you in a semi-conscious state of relaxation (deep sedation). This is because the amount of sedative needed to produce this state can be hard to predict. Too much of a sedative can produce general anesthesia. Monitored anesthesia care is performed by one or more caregivers who have special training in all types of anesthesia. You will need to meet with these caregivers before your procedure. During this meeting, they will ask you about your medical history. They will also give you instructions to follow. (For example, you will need to stop eating and drinking before your procedure. You may also need to stop or change medications you are taking.) During your procedure, your caregivers will stay with you. They will:   Watch your condition. This includes watching you blood pressure, breathing, and level of pain.   Diagnose and treat problems that occur.   Give medications if they are needed. These may include calming medications (sedatives) and anesthetics.   Make sure you are comfortable.  Having monitored anesthesia care does not necessarily mean that you will be under anesthesia. It does mean that your caregivers will be able to manage anesthesia if you need it or if it occurs. It also means that you will be able to have a  different type of anesthesia than you are having if you need it. When your procedure is complete, your caregivers will continue to watch your condition. They will make sure any medications wear off before you are allowed to go home.  Document Released: 12/16/2004 Document Revised: 07/17/2012 Document Reviewed: 05/03/2012 [facility] Patient Information 2014 [place], [place].

## 2013-08-08 ENCOUNTER — Other Ambulatory Visit: Payer: Self-pay

## 2013-08-08 ENCOUNTER — Encounter (HOSPITAL_COMMUNITY)
Admission: RE | Admit: 2013-08-08 | Discharge: 2013-08-08 | Disposition: A | Discharge status: Home / Self Care | Payer: Medicare Other | Source: Ambulatory Visit | Attending: Ophthalmology | Admitting: Ophthalmology

## 2013-08-08 ENCOUNTER — Encounter (HOSPITAL_COMMUNITY): Payer: Self-pay

## 2013-08-08 DIAGNOSIS — Z01810 Encounter for preprocedural cardiovascular examination: Secondary | ICD-10-CM | POA: Diagnosis not present

## 2013-08-08 DIAGNOSIS — Z01812 Encounter for preprocedural laboratory examination: Secondary | ICD-10-CM | POA: Diagnosis not present

## 2013-08-08 HISTORY — DX: Other intervertebral disc displacement, lumbar region: M51.26

## 2013-08-08 LAB — BASIC METABOLIC PANEL
BUN: 30 mg/dL — AB (ref 6–23)
CALCIUM: 10.5 mg/dL (ref 8.4–10.5)
CHLORIDE: 99 meq/L (ref 96–112)
CO2: 25 meq/L (ref 19–32)
CREATININE: 1.06 mg/dL (ref 0.50–1.35)
GFR calc Af Amer: 79 mL/min — ABNORMAL LOW (ref >90)
GFR calc non Af Amer: 68 mL/min — ABNORMAL LOW (ref >90)
Glucose, Bld: 126 mg/dL — ABNORMAL HIGH (ref 70–99)
Potassium: 4.4 mEq/L (ref 3.7–5.3)
Sodium: 140 mEq/L (ref 137–147)

## 2013-08-08 LAB — HEMOGLOBIN AND HEMATOCRIT, BLOOD
HCT: 45.5 % (ref 39.0–52.0)
Hemoglobin: 15.7 g/dL (ref 13.0–17.0)

## 2013-08-08 NOTE — Pre-Procedure Instructions (Signed)
Patient given information to sign up for my chart at home. 

## 2013-08-13 ENCOUNTER — Encounter (HOSPITAL_COMMUNITY): Payer: Self-pay | Admitting: Ophthalmology

## 2013-08-13 ENCOUNTER — Encounter (HOSPITAL_COMMUNITY): Payer: Medicare Other | Admitting: Anesthesiology

## 2013-08-13 ENCOUNTER — Ambulatory Visit (HOSPITAL_COMMUNITY)
Admission: RE | Admit: 2013-08-13 | Discharge: 2013-08-13 | Disposition: A | Discharge status: Home / Self Care | Payer: Medicare Other | Source: Ambulatory Visit | Attending: Ophthalmology | Admitting: Ophthalmology

## 2013-08-13 ENCOUNTER — Encounter (HOSPITAL_COMMUNITY)
Admission: RE | Disposition: A | Discharge status: Home / Self Care | Payer: Self-pay | Source: Ambulatory Visit | Attending: Ophthalmology

## 2013-08-13 ENCOUNTER — Ambulatory Visit (HOSPITAL_COMMUNITY): Payer: Medicare Other | Admitting: Anesthesiology

## 2013-08-13 DIAGNOSIS — H2589 Other age-related cataract: Secondary | ICD-10-CM | POA: Diagnosis not present

## 2013-08-13 DIAGNOSIS — H269 Unspecified cataract: Secondary | ICD-10-CM | POA: Diagnosis not present

## 2013-08-13 DIAGNOSIS — I252 Old myocardial infarction: Secondary | ICD-10-CM | POA: Insufficient documentation

## 2013-08-13 DIAGNOSIS — I2510 Atherosclerotic heart disease of native coronary artery without angina pectoris: Secondary | ICD-10-CM | POA: Insufficient documentation

## 2013-08-13 DIAGNOSIS — Z87891 Personal history of nicotine dependence: Secondary | ICD-10-CM | POA: Diagnosis not present

## 2013-08-13 DIAGNOSIS — F3289 Other specified depressive episodes: Secondary | ICD-10-CM | POA: Insufficient documentation

## 2013-08-13 DIAGNOSIS — Z79899 Other long term (current) drug therapy: Secondary | ICD-10-CM | POA: Diagnosis not present

## 2013-08-13 DIAGNOSIS — I10 Essential (primary) hypertension: Secondary | ICD-10-CM | POA: Insufficient documentation

## 2013-08-13 DIAGNOSIS — K219 Gastro-esophageal reflux disease without esophagitis: Secondary | ICD-10-CM | POA: Insufficient documentation

## 2013-08-13 DIAGNOSIS — F329 Major depressive disorder, single episode, unspecified: Secondary | ICD-10-CM | POA: Diagnosis not present

## 2013-08-13 HISTORY — PX: CATARACT EXTRACTION W/PHACO: SHX586

## 2013-08-13 SURGERY — PHACOEMULSIFICATION, CATARACT, WITH IOL INSERTION
Anesthesia: Monitor Anesthesia Care | Site: Eye | Laterality: Right

## 2013-08-13 MED ORDER — FENTANYL CITRATE 0.05 MG/ML IJ SOLN
INTRAMUSCULAR | Status: AC
  Filled 2013-08-13: qty 2

## 2013-08-13 MED ORDER — POVIDONE-IODINE 5 % OP SOLN
OPHTHALMIC | Status: DC | PRN
  Administered 2013-08-13: 1 via OPHTHALMIC

## 2013-08-13 MED ORDER — PHENYLEPHRINE HCL 2.5 % OP SOLN
OPHTHALMIC | Status: AC
  Filled 2013-08-13: qty 15

## 2013-08-13 MED ORDER — TETRACAINE HCL 0.5 % OP SOLN
1.0000 [drp] | OPHTHALMIC | Status: AC
  Administered 2013-08-13 (×3): 1 [drp] via OPHTHALMIC

## 2013-08-13 MED ORDER — EPINEPHRINE HCL 1 MG/ML IJ SOLN
INTRAOCULAR | Status: DC | PRN
  Administered 2013-08-13: 09:00:00

## 2013-08-13 MED ORDER — MIDAZOLAM HCL 2 MG/2ML IJ SOLN
INTRAMUSCULAR | Status: AC
  Filled 2013-08-13: qty 2

## 2013-08-13 MED ORDER — NEOMYCIN-POLYMYXIN-DEXAMETH 3.5-10000-0.1 OP SUSP
OPHTHALMIC | Status: AC
  Filled 2013-08-13: qty 5

## 2013-08-13 MED ORDER — CYCLOPENTOLATE-PHENYLEPHRINE OP SOLN OPTIME - NO CHARGE
OPHTHALMIC | Status: AC
  Filled 2013-08-13: qty 2

## 2013-08-13 MED ORDER — BSS IO SOLN
INTRAOCULAR | Status: DC | PRN
  Administered 2013-08-13: 15 mL via INTRAOCULAR

## 2013-08-13 MED ORDER — PROVISC 10 MG/ML IO SOLN
INTRAOCULAR | Status: DC | PRN
  Administered 2013-08-13: 0.85 mL via INTRAOCULAR

## 2013-08-13 MED ORDER — PHENYLEPHRINE HCL 2.5 % OP SOLN
1.0000 [drp] | OPHTHALMIC | Status: AC
  Administered 2013-08-13 (×3): 1 [drp] via OPHTHALMIC

## 2013-08-13 MED ORDER — MIDAZOLAM HCL 2 MG/2ML IJ SOLN
1.0000 mg | INTRAMUSCULAR | Status: DC | PRN
Start: 2013-08-13 — End: 2013-08-13
  Administered 2013-08-13 (×2): 1 mg via INTRAVENOUS

## 2013-08-13 MED ORDER — LIDOCAINE HCL (PF) 1 % IJ SOLN
INTRAMUSCULAR | Status: AC
  Filled 2013-08-13: qty 2

## 2013-08-13 MED ORDER — LACTATED RINGERS IV SOLN
INTRAVENOUS | Status: DC
  Administered 2013-08-13: 1000 mL via INTRAVENOUS

## 2013-08-13 MED ORDER — EPINEPHRINE HCL 1 MG/ML IJ SOLN
INTRAMUSCULAR | Status: AC
  Filled 2013-08-13: qty 1

## 2013-08-13 MED ORDER — NEOMYCIN-POLYMYXIN-DEXAMETH 3.5-10000-0.1 OP SUSP
OPHTHALMIC | Status: DC | PRN
  Administered 2013-08-13: 2 [drp] via OPHTHALMIC

## 2013-08-13 MED ORDER — LIDOCAINE HCL (PF) 1 % IJ SOLN
INTRAMUSCULAR | Status: DC | PRN
  Administered 2013-08-13: .8 mL

## 2013-08-13 MED ORDER — FENTANYL CITRATE 0.05 MG/ML IJ SOLN
25.0000 ug | INTRAMUSCULAR | Status: AC
  Administered 2013-08-13 (×2): 25 ug via INTRAVENOUS

## 2013-08-13 MED ORDER — LIDOCAINE HCL 3.5 % OP GEL
1.0000 "application " | Freq: Once | OPHTHALMIC | Status: AC
  Administered 2013-08-13: 1 via OPHTHALMIC

## 2013-08-13 MED ORDER — TETRACAINE HCL 0.5 % OP SOLN
OPHTHALMIC | Status: AC
  Filled 2013-08-13: qty 2

## 2013-08-13 MED ORDER — LIDOCAINE HCL 3.5 % OP GEL
OPHTHALMIC | Status: AC
  Filled 2013-08-13: qty 1

## 2013-08-13 MED ORDER — CYCLOPENTOLATE-PHENYLEPHRINE 0.2-1 % OP SOLN
1.0000 [drp] | OPHTHALMIC | Status: AC
  Administered 2013-08-13 (×3): 1 [drp] via OPHTHALMIC

## 2013-08-13 SURGICAL SUPPLY — 33 items
CAPSULAR TENSION RING-AMO (OPHTHALMIC RELATED) IMPLANT
CLOTH BEACON ORANGE TIMEOUT ST (SAFETY) ×3 IMPLANT
EYE SHIELD UNIVERSAL CLEAR (GAUZE/BANDAGES/DRESSINGS) ×3 IMPLANT
GLOVE BIO SURGEON STRL SZ 6.5 (GLOVE) IMPLANT
GLOVE BIO SURGEONS STRL SZ 6.5 (GLOVE)
GLOVE BIOGEL PI IND STRL 6.5 (GLOVE) IMPLANT
GLOVE BIOGEL PI IND STRL 7.0 (GLOVE) ×1 IMPLANT
GLOVE BIOGEL PI IND STRL 7.5 (GLOVE) ×1 IMPLANT
GLOVE BIOGEL PI INDICATOR 6.5 (GLOVE)
GLOVE BIOGEL PI INDICATOR 7.0 (GLOVE) ×2
GLOVE BIOGEL PI INDICATOR 7.5 (GLOVE) ×2
GLOVE ECLIPSE 6.5 STRL STRAW (GLOVE) IMPLANT
GLOVE ECLIPSE 7.0 STRL STRAW (GLOVE) IMPLANT
GLOVE ECLIPSE 7.5 STRL STRAW (GLOVE) IMPLANT
GLOVE EXAM NITRILE LRG STRL (GLOVE) IMPLANT
GLOVE EXAM NITRILE MD LF STRL (GLOVE) IMPLANT
GLOVE SKINSENSE NS SZ6.5 (GLOVE)
GLOVE SKINSENSE NS SZ7.0 (GLOVE)
GLOVE SKINSENSE STRL SZ6.5 (GLOVE) IMPLANT
GLOVE SKINSENSE STRL SZ7.0 (GLOVE) IMPLANT
KIT VITRECTOMY (OPHTHALMIC RELATED) IMPLANT
PAD ARMBOARD 7.5X6 YLW CONV (MISCELLANEOUS) ×3 IMPLANT
PROC W NO LENS (INTRAOCULAR LENS)
PROC W SPEC LENS (INTRAOCULAR LENS)
PROCESS W NO LENS (INTRAOCULAR LENS) IMPLANT
PROCESS W SPEC LENS (INTRAOCULAR LENS) IMPLANT
RING MALYGIN (MISCELLANEOUS) IMPLANT
SIGHTPATH CAT PROC W REG LENS (Ophthalmic Related) ×3 IMPLANT
SYR TB 1ML LL NO SAFETY (SYRINGE) ×3 IMPLANT
TAPE SURG TRANSPORE 1 IN (GAUZE/BANDAGES/DRESSINGS) ×1 IMPLANT
TAPE SURGICAL TRANSPORE 1 IN (GAUZE/BANDAGES/DRESSINGS) ×2
VISCOELASTIC ADDITIONAL (OPHTHALMIC RELATED) IMPLANT
WATER STERILE IRR 250ML POUR (IV SOLUTION) ×3 IMPLANT

## 2013-08-13 NOTE — Anesthesia Postprocedure Evaluation (Signed)
  Anesthesia Post-op Note  Patient: Rodney Decker  Procedure(s) Performed: Procedure(s) with comments: CATARACT EXTRACTION PHACO AND INTRAOCULAR LENS PLACEMENT (IOC) (Right) - CDE 7.11  Patient Location: Short Stay  Anesthesia Type:MAC  Level of Consciousness: awake, alert , oriented and patient cooperative  Airway and Oxygen Therapy: Patient Spontanous Breathing  Post-op Pain: none  Post-op Assessment: Post-op Vital signs reviewed, Patient's Cardiovascular Status Stable, Respiratory Function Stable, Patent Airway and Pain level controlled  Post-op Vital Signs: Reviewed and stable  Last Vitals:  Filed Vitals:   08/13/13 0845  BP: 114/62  Temp:   Resp: 26    Complications: No apparent anesthesia complications

## 2013-08-13 NOTE — Anesthesia Preprocedure Evaluation (Signed)
Anesthesia Evaluation  Patient identified by MRN, date of birth, ID band Patient awake    Reviewed: Allergy & Precautions, H&P , NPO status , Patient's Chart, lab work & pertinent test results  Airway Mallampati: II TM Distance: >3 FB     Dental  (+) Poor Dentition, Missing, Chipped   Pulmonary former smoker,  breath sounds clear to auscultation        Cardiovascular hypertension, Pt. on medications + CAD and + Past MI Rhythm:Regular Rate:Normal     Neuro/Psych PSYCHIATRIC DISORDERS Depression    GI/Hepatic GERD-  Controlled and Medicated,  Endo/Other    Renal/GU      Musculoskeletal   Abdominal   Peds  Hematology   Anesthesia Other Findings   Reproductive/Obstetrics                           Anesthesia Physical Anesthesia Plan  ASA: III  Anesthesia Plan: MAC   Post-op Pain Management:    Induction: Intravenous  Airway Management Planned: Nasal Cannula  Additional Equipment:   Intra-op Plan:   Post-operative Plan:   Informed Consent: I have reviewed the patients History and Physical, chart, labs and discussed the procedure including the risks, benefits and alternatives for the proposed anesthesia with the patient or authorized representative who has indicated his/her understanding and acceptance.     Plan Discussed with:   Anesthesia Plan Comments:         Anesthesia Quick Evaluation

## 2013-08-13 NOTE — Transfer of Care (Signed)
Immediate Anesthesia Transfer of Care Note  Patient: Rodney Decker  Procedure(s) Performed: Procedure(s) with comments: CATARACT EXTRACTION PHACO AND INTRAOCULAR LENS PLACEMENT (IOC) (Right) - CDE 7.11  Patient Location: Short Stay  Anesthesia Type:MAC  Level of Consciousness: awake, alert , oriented and patient cooperative  Airway & Oxygen Therapy: Patient Spontanous Breathing  Post-op Assessment: Report given to PACU RN and Post -op Vital signs reviewed and stable  Post vital signs: Reviewed and stable  Complications: No apparent anesthesia complications

## 2013-08-13 NOTE — Discharge Instructions (Signed)

## 2013-08-13 NOTE — H&P (Signed)
I have reviewed the H&P, the patient was re-examined, and I have identified no interval changes in medical condition and plan of care since the history and physical of record  

## 2013-08-13 NOTE — Op Note (Signed)
Date of Admission: 08/13/2013  Date of Surgery: 08/13/2013  Pre-Op Dx: Cataract Right  Eye  Post-Op Dx: Combined Cataract  Right  Eye,  Dx Code 366.19  Surgeon: Tonny Branch, M.D.  Assistants: None  Anesthesia: Topical with MAC  Indications: Painless, progressive loss of vision with compromise of daily activities.  Surgery: Cataract Extraction with Intraocular lens Implant Right Eye  Discription: The patient had dilating drops and viscous lidocaine placed into the Right eye in the pre-op holding area. After transfer to the operating room, a time out was performed. The patient was then prepped and draped. Beginning with a 69 degree blade a paracentesis port was made at the surgeon's 2 o'clock position. The anterior chamber was then filled with 1% non-preserved lidocaine. This was followed by filling the anterior chamber with Provisc.  A 2.66mm keratome blade was used to make a clear corneal incision at the temporal limbus.  A bent cystatome needle was used to create a continuous tear capsulotomy. Hydrodissection was performed with balanced salt solution on a Fine canula. The lens nucleus was then removed using the phacoemulsification handpiece. Residual cortex was removed with the I&A handpiece. The anterior chamber and capsular bag were refilled with Provisc. A posterior chamber intraocular lens was placed into the capsular bag with it's injector. The implant was positioned with the Kuglan hook. The Provisc was then removed from the anterior chamber and capsular bag with the I&A handpiece. Stromal hydration of the main incision and paracentesis port was performed with BSS on a Fine canula. The wounds were tested for leak which was negative. The patient tolerated the procedure well. There were no operative complications. The patient was then transferred to the recovery room in stable condition.  Complications: None  Specimen: None  EBL: None  Prosthetic device: B&L enVista, MX60, power 23.5, SN  [id number].

## 2013-08-14 ENCOUNTER — Encounter (HOSPITAL_COMMUNITY): Payer: Self-pay | Admitting: Ophthalmology

## 2013-08-15 ENCOUNTER — Ambulatory Visit: Payer: Medicare Other | Admitting: Cardiovascular Disease

## 2013-08-30 ENCOUNTER — Encounter (HOSPITAL_COMMUNITY): Payer: Self-pay | Admitting: Pharmacy Technician

## 2013-08-30 DIAGNOSIS — H2589 Other age-related cataract: Secondary | ICD-10-CM | POA: Diagnosis not present

## 2013-09-03 ENCOUNTER — Encounter (HOSPITAL_COMMUNITY)
Admission: RE | Admit: 2013-09-03 | Discharge: 2013-09-03 | Disposition: A | Discharge status: Home / Self Care | Payer: Medicare Other | Source: Ambulatory Visit | Attending: Ophthalmology | Admitting: Ophthalmology

## 2013-09-03 ENCOUNTER — Encounter (HOSPITAL_COMMUNITY): Payer: Self-pay

## 2013-09-03 MED ORDER — FENTANYL CITRATE 0.05 MG/ML IJ SOLN: 25.0000 ug | INTRAMUSCULAR | Status: DC | PRN

## 2013-09-03 MED ORDER — ONDANSETRON HCL 4 MG/2ML IJ SOLN: 4.0000 mg | Freq: Once | INTRAMUSCULAR | Status: AC | PRN

## 2013-09-05 MED ORDER — CYCLOPENTOLATE-PHENYLEPHRINE OP SOLN OPTIME - NO CHARGE
OPHTHALMIC | Status: AC
  Filled 2013-09-05: qty 2

## 2013-09-05 MED ORDER — TETRACAINE HCL 0.5 % OP SOLN
OPHTHALMIC | Status: AC
  Filled 2013-09-05: qty 2

## 2013-09-05 MED ORDER — NEOMYCIN-POLYMYXIN-DEXAMETH 3.5-10000-0.1 OP SUSP
OPHTHALMIC | Status: AC
  Filled 2013-09-05: qty 5

## 2013-09-05 MED ORDER — PHENYLEPHRINE HCL 2.5 % OP SOLN
OPHTHALMIC | Status: AC
  Filled 2013-09-05: qty 15

## 2013-09-06 ENCOUNTER — Ambulatory Visit (HOSPITAL_COMMUNITY)
Admission: RE | Admit: 2013-09-06 | Discharge: 2013-09-06 | Disposition: A | Discharge status: Home / Self Care | Payer: Medicare Other | Source: Ambulatory Visit | Attending: Ophthalmology | Admitting: Ophthalmology

## 2013-09-06 ENCOUNTER — Encounter (HOSPITAL_COMMUNITY): Payer: Self-pay | Admitting: Ophthalmology

## 2013-09-06 ENCOUNTER — Ambulatory Visit (HOSPITAL_COMMUNITY): Payer: Medicare Other | Admitting: Anesthesiology

## 2013-09-06 ENCOUNTER — Encounter (HOSPITAL_COMMUNITY)
Admission: RE | Disposition: A | Discharge status: Home / Self Care | Payer: Self-pay | Source: Ambulatory Visit | Attending: Ophthalmology

## 2013-09-06 ENCOUNTER — Encounter (HOSPITAL_COMMUNITY): Payer: Medicare Other | Admitting: Anesthesiology

## 2013-09-06 DIAGNOSIS — H269 Unspecified cataract: Secondary | ICD-10-CM | POA: Diagnosis not present

## 2013-09-06 DIAGNOSIS — H2589 Other age-related cataract: Secondary | ICD-10-CM | POA: Diagnosis not present

## 2013-09-06 HISTORY — PX: CATARACT EXTRACTION W/PHACO: SHX586

## 2013-09-06 SURGERY — PHACOEMULSIFICATION, CATARACT, WITH IOL INSERTION
Anesthesia: Monitor Anesthesia Care | Site: Eye | Laterality: Left

## 2013-09-06 MED ORDER — FENTANYL CITRATE 0.05 MG/ML IJ SOLN: 25.0000 ug | INTRAMUSCULAR | Status: DC | PRN

## 2013-09-06 MED ORDER — NEOMYCIN-POLYMYXIN-DEXAMETH 3.5-10000-0.1 OP SUSP
OPHTHALMIC | Status: DC | PRN
  Administered 2013-09-06: 2 [drp] via OPHTHALMIC

## 2013-09-06 MED ORDER — PROVISC 10 MG/ML IO SOLN
INTRAOCULAR | Status: DC | PRN
  Administered 2013-09-06: 0.85 mL via INTRAOCULAR

## 2013-09-06 MED ORDER — TETRACAINE HCL 0.5 % OP SOLN: 1.0000 [drp] | OPHTHALMIC | Status: DC

## 2013-09-06 MED ORDER — MIDAZOLAM HCL 2 MG/2ML IJ SOLN
INTRAMUSCULAR | Status: AC
  Filled 2013-09-06: qty 2

## 2013-09-06 MED ORDER — LACTATED RINGERS IV SOLN
INTRAVENOUS | Status: DC
  Administered 2013-09-06: 13:00:00 via INTRAVENOUS

## 2013-09-06 MED ORDER — CYCLOPENTOLATE-PHENYLEPHRINE 0.2-1 % OP SOLN: 1.0000 [drp] | OPHTHALMIC | Status: DC | PRN

## 2013-09-06 MED ORDER — LIDOCAINE HCL (PF) 1 % IJ SOLN
INTRAMUSCULAR | Status: AC
  Filled 2013-09-06: qty 2

## 2013-09-06 MED ORDER — POVIDONE-IODINE 5 % OP SOLN
OPHTHALMIC | Status: DC | PRN
  Administered 2013-09-06: 1 via OPHTHALMIC

## 2013-09-06 MED ORDER — TETRACAINE HCL 0.5 % OP SOLN
1.0000 [drp] | OPHTHALMIC | Status: AC
  Administered 2013-09-06 (×3): 1 [drp] via OPHTHALMIC

## 2013-09-06 MED ORDER — MIDAZOLAM HCL 2 MG/2ML IJ SOLN
1.0000 mg | INTRAMUSCULAR | Status: DC | PRN
  Administered 2013-09-06: 2 mg via INTRAVENOUS

## 2013-09-06 MED ORDER — BSS IO SOLN
INTRAOCULAR | Status: DC | PRN
  Administered 2013-09-06: 15 mL via INTRAOCULAR

## 2013-09-06 MED ORDER — EPINEPHRINE HCL 1 MG/ML IJ SOLN
INTRAOCULAR | Status: DC | PRN
  Administered 2013-09-06: 13:00:00

## 2013-09-06 MED ORDER — EPINEPHRINE HCL 1 MG/ML IJ SOLN
INTRAMUSCULAR | Status: AC
  Filled 2013-09-06: qty 1

## 2013-09-06 MED ORDER — CYCLOPENTOLATE-PHENYLEPHRINE 0.2-1 % OP SOLN: 1.0000 [drp] | OPHTHALMIC | Status: DC

## 2013-09-06 MED ORDER — ONDANSETRON HCL 4 MG/2ML IJ SOLN: 4.0000 mg | Freq: Once | INTRAMUSCULAR | Status: DC | PRN

## 2013-09-06 MED ORDER — LIDOCAINE HCL (PF) 1 % IJ SOLN
INTRAMUSCULAR | Status: DC | PRN
  Administered 2013-09-06: .6 mL

## 2013-09-06 MED ORDER — LIDOCAINE HCL (PF) 1 % IJ SOLN
INTRAMUSCULAR | Status: AC
  Filled 2013-09-06: qty 5

## 2013-09-06 MED ORDER — CYCLOPENTOLATE-PHENYLEPHRINE 0.2-1 % OP SOLN
1.0000 [drp] | OPHTHALMIC | Status: AC
  Administered 2013-09-06 (×3): 1 [drp] via OPHTHALMIC

## 2013-09-06 MED ORDER — PHENYLEPHRINE HCL 2.5 % OP SOLN: 1.0000 [drp] | OPHTHALMIC | Status: DC

## 2013-09-06 MED ORDER — TETRACAINE HCL 0.5 % OP SOLN: 1.0000 [drp] | OPHTHALMIC | Status: DC | PRN

## 2013-09-06 MED ORDER — FENTANYL CITRATE 0.05 MG/ML IJ SOLN
25.0000 ug | INTRAMUSCULAR | Status: AC
  Administered 2013-09-06: 25 ug via INTRAVENOUS

## 2013-09-06 MED ORDER — LIDOCAINE HCL 3.5 % OP GEL
1.0000 "application " | Freq: Once | OPHTHALMIC | Status: AC
  Administered 2013-09-06: 1 via OPHTHALMIC

## 2013-09-06 MED ORDER — PHENYLEPHRINE HCL 2.5 % OP SOLN: 1.0000 [drp] | OPHTHALMIC | Status: DC | PRN

## 2013-09-06 MED ORDER — PHENYLEPHRINE HCL 2.5 % OP SOLN
1.0000 [drp] | OPHTHALMIC | Status: AC
  Administered 2013-09-06 (×3): 1 [drp] via OPHTHALMIC

## 2013-09-06 MED ORDER — FENTANYL CITRATE 0.05 MG/ML IJ SOLN
INTRAMUSCULAR | Status: AC
  Filled 2013-09-06: qty 2

## 2013-09-06 MED ORDER — MIDAZOLAM HCL 5 MG/5ML IJ SOLN
INTRAMUSCULAR | Status: DC | PRN
  Administered 2013-09-06: 2 mg via INTRAVENOUS

## 2013-09-06 SURGICAL SUPPLY — 33 items
CAPSULAR TENSION RING-AMO (OPHTHALMIC RELATED) IMPLANT
CLOTH BEACON ORANGE TIMEOUT ST (SAFETY) ×3 IMPLANT
EYE SHIELD UNIVERSAL CLEAR (GAUZE/BANDAGES/DRESSINGS) ×3 IMPLANT
GLOVE BIO SURGEON STRL SZ 6.5 (GLOVE) IMPLANT
GLOVE BIO SURGEONS STRL SZ 6.5 (GLOVE)
GLOVE BIOGEL PI IND STRL 6.5 (GLOVE) ×1 IMPLANT
GLOVE BIOGEL PI IND STRL 7.0 (GLOVE) ×2 IMPLANT
GLOVE BIOGEL PI IND STRL 7.5 (GLOVE) IMPLANT
GLOVE BIOGEL PI INDICATOR 6.5 (GLOVE) ×2
GLOVE BIOGEL PI INDICATOR 7.0 (GLOVE) ×4
GLOVE BIOGEL PI INDICATOR 7.5 (GLOVE)
GLOVE ECLIPSE 6.5 STRL STRAW (GLOVE) IMPLANT
GLOVE ECLIPSE 7.0 STRL STRAW (GLOVE) IMPLANT
GLOVE ECLIPSE 7.5 STRL STRAW (GLOVE) IMPLANT
GLOVE EXAM NITRILE LRG STRL (GLOVE) IMPLANT
GLOVE EXAM NITRILE MD LF STRL (GLOVE) IMPLANT
GLOVE SKINSENSE NS SZ6.5 (GLOVE)
GLOVE SKINSENSE NS SZ7.0 (GLOVE)
GLOVE SKINSENSE STRL SZ6.5 (GLOVE) IMPLANT
GLOVE SKINSENSE STRL SZ7.0 (GLOVE) IMPLANT
KIT VITRECTOMY (OPHTHALMIC RELATED) IMPLANT
PAD ARMBOARD 7.5X6 YLW CONV (MISCELLANEOUS) ×3 IMPLANT
PROC W NO LENS (INTRAOCULAR LENS)
PROC W SPEC LENS (INTRAOCULAR LENS)
PROCESS W NO LENS (INTRAOCULAR LENS) IMPLANT
PROCESS W SPEC LENS (INTRAOCULAR LENS) IMPLANT
RING MALYGIN (MISCELLANEOUS) IMPLANT
SIGHTPATH CAT PROC W REG LENS (Ophthalmic Related) ×3 IMPLANT
SYR TB 1ML LL NO SAFETY (SYRINGE) ×3 IMPLANT
TAPE SURG TRANSPORE 1 IN (GAUZE/BANDAGES/DRESSINGS) ×1 IMPLANT
TAPE SURGICAL TRANSPORE 1 IN (GAUZE/BANDAGES/DRESSINGS) ×2
VISCOELASTIC ADDITIONAL (OPHTHALMIC RELATED) IMPLANT
WATER STERILE IRR 250ML POUR (IV SOLUTION) ×3 IMPLANT

## 2013-09-06 NOTE — Anesthesia Postprocedure Evaluation (Signed)
  Anesthesia Post-op Note  Patient: Rodney Decker  Procedure(s) Performed: Procedure(s) with comments: CATARACT EXTRACTION PHACO AND INTRAOCULAR LENS PLACEMENT (IOC) (Left) - CDE: 9.87  Patient Location: Short Stay  Anesthesia Type:MAC  Level of Consciousness: awake, alert  and oriented  Airway and Oxygen Therapy: Patient Spontanous Breathing  Post-op Pain: none  Post-op Assessment: Post-op Vital signs reviewed, Patient's Cardiovascular Status Stable, Respiratory Function Stable, Patent Airway and No signs of Nausea or vomiting  Post-op Vital Signs: Reviewed and stable  Last Vitals:  Filed Vitals:   09/06/13 1245  BP: 122/74  Pulse:   Temp:   Resp: 22    Complications: No apparent anesthesia complications

## 2013-09-06 NOTE — Discharge Instructions (Signed)

## 2013-09-06 NOTE — Anesthesia Procedure Notes (Signed)
Procedure Name: MAC Date/Time: 09/06/2013 12:55 PM Performed by: Vista Deck Pre-anesthesia Checklist: Patient identified, Emergency Drugs available, Suction available, Timeout performed and Patient being monitored Patient Re-evaluated:Patient Re-evaluated prior to inductionOxygen Delivery Method: Nasal Cannula

## 2013-09-06 NOTE — Anesthesia Preprocedure Evaluation (Signed)
Anesthesia Evaluation  Patient identified by MRN, date of birth, ID band Patient awake    Reviewed: Allergy & Precautions, H&P , NPO status , Patient's Chart, lab work & pertinent test results  Airway Mallampati: II TM Distance: >3 FB     Dental  (+) Poor Dentition, Missing, Chipped   Pulmonary former smoker,  breath sounds clear to auscultation        Cardiovascular hypertension, Pt. on medications + CAD and + Past MI Rhythm:Regular Rate:Normal     Neuro/Psych PSYCHIATRIC DISORDERS Depression    GI/Hepatic GERD-  Controlled and Medicated,  Endo/Other    Renal/GU      Musculoskeletal   Abdominal   Peds  Hematology   Anesthesia Other Findings   Reproductive/Obstetrics                           Anesthesia Physical Anesthesia Plan  ASA: III  Anesthesia Plan: MAC   Post-op Pain Management:    Induction: Intravenous  Airway Management Planned: Nasal Cannula  Additional Equipment:   Intra-op Plan:   Post-operative Plan:   Informed Consent: I have reviewed the patients History and Physical, chart, labs and discussed the procedure including the risks, benefits and alternatives for the proposed anesthesia with the patient or authorized representative who has indicated his/her understanding and acceptance.     Plan Discussed with:   Anesthesia Plan Comments:         Anesthesia Quick Evaluation  

## 2013-09-06 NOTE — Op Note (Signed)
Date of Admission: 09/06/2013  Date of Surgery: 09/06/2013  Pre-Op Dx: Cataract Left  Eye  Post-Op Dx: Cataract  Left  Eye,  Dx Code 366.19  Surgeon: Tonny Branch, M.D.  Assistants: None  Anesthesia: Topical with MAC  Indications: Painless, progressive loss of vision with compromise of daily activities.  Surgery: Cataract Extraction with Intraocular lens Implant Left Eye  Discription: The patient had dilating drops and viscous lidocaine placed into the Left eye in the pre-op holding area. After transfer to the operating room, a time out was performed. The patient was then prepped and draped. Beginning with a 70 degree blade a paracentesis port was made at the surgeon's 2 o'clock position. The anterior chamber was then filled with 1% non-preserved lidocaine. This was followed by filling the anterior chamber with Provisc.  A 2.43mm keratome blade was used to make a clear corneal incision at the temporal limbus.  A bent cystatome needle was used to create a continuous tear capsulotomy. Hydrodissection was performed with balanced salt solution on a Fine canula. The lens nucleus was then removed using the phacoemulsification handpiece. Residual cortex was removed with the I&A handpiece. The anterior chamber and capsular bag were refilled with Provisc. A posterior chamber intraocular lens was placed into the capsular bag with it's injector. The implant was positioned with the Kuglan hook. The Provisc was then removed from the anterior chamber and capsular bag with the I&A handpiece. Stromal hydration of the main incision and paracentesis port was performed with BSS on a Fine canula. The wounds were tested for leak which was negative. The patient tolerated the procedure well. There were no operative complications. The patient was then transferred to the recovery room in stable condition.  Complications: None  Specimen: None  EBL: None  Prosthetic device: B&L enVista, MX60, power 23.0D, SN [id number].

## 2013-09-06 NOTE — Transfer of Care (Signed)
Immediate Anesthesia Transfer of Care Note  Patient: Rodney Decker  Procedure(s) Performed: Procedure(s) with comments: CATARACT EXTRACTION PHACO AND INTRAOCULAR LENS PLACEMENT (IOC) (Left) - CDE: 9.87  Patient Location: Short Stay  Anesthesia Type:MAC  Level of Consciousness: awake  Airway & Oxygen Therapy: Patient Spontanous Breathing  Post-op Assessment: Report given to PACU RN  Post vital signs: Reviewed and stable  Complications: No apparent anesthesia complications

## 2013-09-06 NOTE — H&P (Signed)
I have reviewed the H&P, the patient was re-examined, and I have identified no interval changes in medical condition and plan of care since the history and physical of record  

## 2013-09-07 ENCOUNTER — Encounter (HOSPITAL_COMMUNITY): Payer: Self-pay | Admitting: Ophthalmology

## 2013-09-10 ENCOUNTER — Ambulatory Visit (INDEPENDENT_AMBULATORY_CARE_PROVIDER_SITE_OTHER): Payer: Medicare Other | Admitting: Cardiovascular Disease

## 2013-09-10 VITALS — BP 110/78 | HR 79 | Ht 71.0 in | Wt 214.0 lb

## 2013-09-10 DIAGNOSIS — E782 Mixed hyperlipidemia: Secondary | ICD-10-CM

## 2013-09-10 DIAGNOSIS — I2510 Atherosclerotic heart disease of native coronary artery without angina pectoris: Secondary | ICD-10-CM | POA: Diagnosis not present

## 2013-09-10 DIAGNOSIS — I10 Essential (primary) hypertension: Secondary | ICD-10-CM | POA: Diagnosis not present

## 2013-09-10 DIAGNOSIS — Z951 Presence of aortocoronary bypass graft: Secondary | ICD-10-CM

## 2013-09-10 NOTE — Patient Instructions (Signed)
Your physician wants you to follow-up in: 6 months You will receive a reminder letter in the mail two months in advance. If you don't receive a letter, please call our office to schedule the follow-up appointment.    Your physician has recommended you make the following change in your medication:     STOP Plavix   START Aspirin 81 mg daily    Thank you for choosing [facility] !

## 2013-09-10 NOTE — Progress Notes (Signed)
Patient ID: Rodney Decker, male   DOB: 1940/08/22, 73 y.o.   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 He reportedly asks for frequent testing due to his anxiety. He also has a h/o GERD and GI-related symptoms. Today, he denies chest pain and shortness of breath. He mentions he has a bowel movement three times daily. He denies a h/o stroke.      Allergies  Allergen Reactions  . Claritin [Loratadine] Other (See Comments)    unknown  . Cymbalta [Duloxetine Hcl] Rash    Current Outpatient Prescriptions  Medication Sig Dispense Refill  . buPROPion (WELLBUTRIN) 100 MG tablet Take 100 mg by mouth 3 (three) times daily.      . clopidogrel (PLAVIX) 75 MG tablet TAKE ONE TABLET ONCE DAILY  30 tablet  5  . esomeprazole (NEXIUM) 40 MG capsule Take 40 mg by mouth daily as needed. For acid reflux      . lisinopril (PRINIVIL,ZESTRIL) 5 MG tablet Take 5 mg by mouth daily.       Marland Kitchen LORazepam (ATIVAN) 2 MG tablet Take 2 mg by mouth daily as needed. For anxiety/sleep      . mirtazapine (REMERON) 30 MG tablet Take 30 mg by mouth at bedtime.      . rosuvastatin (CRESTOR) 5 MG tablet Take 1 tablet (5 mg total) by mouth daily.  30 tablet  5   No current facility-administered medications for this visit.    Past Medical History  Diagnosis Date  . Hypertension   . CAD (coronary artery disease)   . Insomnia   . Depression   . Colon polyps   . GERD (gastroesophageal reflux disease)   . Dyslipidemia   . Myocardial infarction 2011  . Arthritis   . Ruptured lumbar disc     Past Surgical History  Procedure Laterality Date  . Colonoscopy 10/10/2001 for surveillqance for colonic polyps.      Biopsy revealed hyperplastic polyps. Next colonoscopy in 3 yrs.  Marland Kitchen Appendectomy    . Colonoscopy  03/24/2011    Procedure:  COLONOSCOPY;  Surgeon: Rogene Houston, MD;  Location: [facility];  Service: Endoscopy;  Laterality: N/A;  9:30  . Cardiac surgery    . Cardiac catheterization  04/17/2009    referred for CABG  . Coronary artery bypass graft  2011    x5   . Hernia repair      left inguinal- Dr Romona Curls  . Cataract extraction w/phaco Right 08/13/2013    Procedure: CATARACT EXTRACTION PHACO AND INTRAOCULAR LENS PLACEMENT (IOC);  Surgeon: Tonny Branch, MD;  Location: [facility];  Service: Ophthalmology;  Laterality: Right;  CDE 7.11  . Cataract extraction w/phaco Left 09/06/2013    Procedure: CATARACT EXTRACTION PHACO AND INTRAOCULAR LENS PLACEMENT (IOC);  Surgeon: Tonny Branch, MD;  Location: [facility];  Service: Ophthalmology;  Laterality: Left;  CDE: 9.87    History   Social History  . Marital Status: Single    Spouse Name: N/A    Number of Children: 0  . Years of Education: N/A   Occupational History  .     Social History Main Topics  . Smoking status: Former Smoker -- 0.50 packs/day for 40 years    Types: Cigarettes    Quit date: 03/14/2005  . Smokeless tobacco: Never Used  Comment: Patient states that he smoked 10 cigarettes a day  . Alcohol Use: No  . Drug Use: No  . Sexual Activity: Yes    Birth Control/ Protection: None   Other Topics Concern  . Not on file   Social History Narrative  . No narrative on file     Filed Vitals:   09/10/13 1331  BP: 110/78  Pulse: 79  Height: 5\' 11"  (1.803 m)  Weight: 214 lb (97.07 kg)  SpO2: 98%    PHYSICAL EXAM General: NAD Neck: No JVD, no thyromegaly. Lungs: Clear to auscultation bilaterally with normal respiratory effort. CV: Nondisplaced PMI.  Regular rate and rhythm, normal S1/S2, no S3/S4, no murmur. No pretibial or periankle edema.  No carotid bruit.  Normal pedal pulses.  Abdomen: Soft, nontender, no hepatosplenomegaly, no distention.  Neurologic: Alert and oriented x 3.  Psych: Normal affect. Extremities: No clubbing or cyanosis.    ECG: reviewed and available in electronic records.      ASSESSMENT AND PLAN: 1. CAD/CABG: Symptomatically stable. Will d/c Plavix and start ASA 81 mg daily. Continue Crestor 5 mg daily. 2. HTN: Well controlled on lisinopril 5 mg daily. 3. Hyperlipidemia: Will check lipids at next visit. Continue Crestor 5 mg daily.  Dispo: f/u 6 months.  Kate Sable, M.D., F.A.C.C.

## 2013-09-17 DIAGNOSIS — Z23 Encounter for immunization: Secondary | ICD-10-CM | POA: Diagnosis not present

## 2013-09-17 DIAGNOSIS — Z6832 Body mass index (BMI) 32.0-32.9, adult: Secondary | ICD-10-CM | POA: Diagnosis not present

## 2013-09-17 DIAGNOSIS — G8929 Other chronic pain: Secondary | ICD-10-CM | POA: Diagnosis not present

## 2013-10-25 ENCOUNTER — Other Ambulatory Visit: Payer: Self-pay | Admitting: Internal Medicine

## 2013-10-26 NOTE — Telephone Encounter (Signed)
Rx refill sent to patient pharmacy   

## 2013-10-29 ENCOUNTER — Encounter: Payer: Self-pay | Admitting: *Deleted

## 2013-10-29 ENCOUNTER — Telehealth: Payer: Self-pay | Admitting: *Deleted

## 2013-10-29 DIAGNOSIS — I2510 Atherosclerotic heart disease of native coronary artery without angina pectoris: Secondary | ICD-10-CM

## 2013-10-29 MED ORDER — ATORVASTATIN CALCIUM 40 MG PO TABS: 40.0000 mg | ORAL_TABLET | Freq: Every day | ORAL | Status: DC

## 2013-10-29 NOTE — Telephone Encounter (Signed)
Would switch to Lipitor 40 mg daily and check LFT's in 6 weeks, with repeat lipids in 3 months.

## 2013-10-29 NOTE — Telephone Encounter (Signed)
Please advise 

## 2013-10-29 NOTE — Telephone Encounter (Signed)
Spoke to patient concerning lab/test results/instructions from provider. Patient understood.    

## 2013-10-29 NOTE — Telephone Encounter (Signed)
Pt wants to know if he can switch from crestor to generic.

## 2013-11-15 ENCOUNTER — Telehealth: Payer: Self-pay | Admitting: Cardiovascular Disease

## 2013-11-15 NOTE — Telephone Encounter (Signed)
Pt confused that he needed to lft's mid September and lipids in 3 months

## 2013-11-15 NOTE — Telephone Encounter (Signed)
Please call patient in regards to lab work / tgs

## 2013-11-20 DIAGNOSIS — G8929 Other chronic pain: Secondary | ICD-10-CM | POA: Diagnosis not present

## 2013-11-20 DIAGNOSIS — K625 Hemorrhage of anus and rectum: Secondary | ICD-10-CM | POA: Diagnosis not present

## 2013-11-20 DIAGNOSIS — Z6832 Body mass index (BMI) 32.0-32.9, adult: Secondary | ICD-10-CM | POA: Diagnosis not present

## 2013-11-28 ENCOUNTER — Encounter (INDEPENDENT_AMBULATORY_CARE_PROVIDER_SITE_OTHER): Payer: Self-pay | Admitting: *Deleted

## 2013-11-29 ENCOUNTER — Telehealth (INDEPENDENT_AMBULATORY_CARE_PROVIDER_SITE_OTHER): Payer: Self-pay | Admitting: *Deleted

## 2013-11-29 ENCOUNTER — Ambulatory Visit (INDEPENDENT_AMBULATORY_CARE_PROVIDER_SITE_OTHER): Payer: Medicare Other | Admitting: Internal Medicine

## 2013-11-29 ENCOUNTER — Other Ambulatory Visit (INDEPENDENT_AMBULATORY_CARE_PROVIDER_SITE_OTHER): Payer: Self-pay | Admitting: *Deleted

## 2013-11-29 ENCOUNTER — Encounter (INDEPENDENT_AMBULATORY_CARE_PROVIDER_SITE_OTHER): Payer: Self-pay | Admitting: Internal Medicine

## 2013-11-29 VITALS — BP 132/70 | HR 60 | Temp 98.6°F | Ht 71.0 in | Wt 209.5 lb

## 2013-11-29 DIAGNOSIS — Z1211 Encounter for screening for malignant neoplasm of colon: Secondary | ICD-10-CM

## 2013-11-29 DIAGNOSIS — Z860100 Personal history of colon polyps, unspecified: Secondary | ICD-10-CM

## 2013-11-29 DIAGNOSIS — I2510 Atherosclerotic heart disease of native coronary artery without angina pectoris: Secondary | ICD-10-CM

## 2013-11-29 DIAGNOSIS — K5900 Constipation, unspecified: Secondary | ICD-10-CM

## 2013-11-29 LAB — CBC WITH DIFFERENTIAL/PLATELET
BASOS ABS: 0 10*3/uL (ref 0.0–0.1)
BASOS PCT: 0 % (ref 0–1)
Eosinophils Absolute: 0.3 10*3/uL (ref 0.0–0.7)
Eosinophils Relative: 3 % (ref 0–5)
HCT: 42.5 % (ref 39.0–52.0)
Hemoglobin: 14.4 g/dL (ref 13.0–17.0)
Lymphocytes Relative: 39 % (ref 12–46)
Lymphs Abs: 3.4 10*3/uL (ref 0.7–4.0)
MCH: 30.6 pg (ref 26.0–34.0)
MCHC: 33.9 g/dL (ref 30.0–36.0)
MCV: 90.4 fL (ref 78.0–100.0)
Monocytes Absolute: 0.9 10*3/uL (ref 0.1–1.0)
Monocytes Relative: 10 % (ref 3–12)
NEUTROS ABS: 4.2 10*3/uL (ref 1.7–7.7)
NEUTROS PCT: 48 % (ref 43–77)
PLATELETS: 296 10*3/uL (ref 150–400)
RBC: 4.7 MIL/uL (ref 4.22–5.81)
RDW: 13.3 % (ref 11.5–15.5)
WBC: 8.8 10*3/uL (ref 4.0–10.5)

## 2013-11-29 MED ORDER — PEG 3350-KCL-NA BICARB-NACL 420 G PO SOLR
4000.0000 mL | Freq: Once | ORAL | Status: DC
Start: 2013-11-29 — End: 2013-12-05

## 2013-11-29 NOTE — Progress Notes (Signed)
Subjective:    Patient ID: Rodney Decker, male    DOB: 27-May-1940, 73 y.o.   MRN: [id number]  HPIPresents today with c/o that his BMs are not regular. He uses a laxative on a prn basis. He has disimpacted himself in the past.  He usually has a BM in the am. He says it may take him an hour to have a BM.  He has had some rectal bleeding which he says the stool was hard. Saw the blood on the toilet tissue. He has been constipated off an on for about a year. Appetite is good. No weight loss.  He has gained 6 pounds since his last visit with [place] in 2012. No abdominal pain.   Did not return for repeat colonoscopy in 2013. He says he just was not up to it after CABG.   03/24/2011 Colonoscopy with polypectomy: Cecal Withdrawal Time: 53 minutes  Impression:  Examination performed to cecum.  11 polyps removed; 3 were biopsied and 8 were snared  3 were ablated via cold biopsy from cecum; these were small.  Largest polyp was at cecum about 12 mm. 2 hemoclips applied to polypectomy site to control oozing.  Of the polyps could not be removed on today's exam. Notes Recorded by Rogene Houston, MD on 03/28/2011 at 11:03 AM Biopsy results reviewed with the patient. 2 polyps were serrated adenomas; 2 were tubular adenomas; Rest of the polyps were hyperplastic. He still has some small polyps left behind. He would return for repeat colonoscopy in one year. Reports to PCP   Review of Systems Past Medical History  Diagnosis Date  . Hypertension   . CAD (coronary artery disease)   . Insomnia   . Depression   . Colon polyps   . GERD (gastroesophageal reflux disease)   . Dyslipidemia   . Myocardial infarction 2011  . Arthritis   . Ruptured lumbar disc     Past Surgical History  Procedure Laterality Date  . Colonoscopy 10/10/2001 for surveillqance for colonic polyps.      Biopsy revealed hyperplastic polyps. Next colonoscopy in 3 yrs.  Marland Kitchen Appendectomy    . Colonoscopy  03/24/2011   Procedure: COLONOSCOPY;  Surgeon: Rogene Houston, MD;  Location: [facility];  Service: Endoscopy;  Laterality: N/A;  9:30  . Cardiac surgery    . Cardiac catheterization  04/17/2009    referred for CABG  . Coronary artery bypass graft  2011    x5   . Hernia repair      left inguinal- Dr Romona Curls  . Cataract extraction w/phaco Right 08/13/2013    Procedure: CATARACT EXTRACTION PHACO AND INTRAOCULAR LENS PLACEMENT (IOC);  Surgeon: Tonny Branch, MD;  Location: [facility];  Service: Ophthalmology;  Laterality: Right;  CDE 7.11  . Cataract extraction w/phaco Left 09/06/2013    Procedure: CATARACT EXTRACTION PHACO AND INTRAOCULAR LENS PLACEMENT (IOC);  Surgeon: Tonny Branch, MD;  Location: [facility];  Service: Ophthalmology;  Laterality: Left;  CDE: 9.87    Allergies  Allergen Reactions  . Claritin [Loratadine] Other (See Comments)    unknown  . Cymbalta [Duloxetine Hcl] Rash    Current Outpatient Prescriptions on File Prior to Visit  Medication Sig Dispense Refill  . aspirin 81 MG tablet Take 81 mg by mouth daily.      Marland Kitchen atorvastatin (LIPITOR) 40 MG tablet Take 1 tablet (40 mg total) by mouth daily.  30 tablet  6  . buPROPion (WELLBUTRIN) 100 MG tablet Take 100 mg  by mouth 3 (three) times daily.      Marland Kitchen esomeprazole (NEXIUM) 40 MG capsule Take 40 mg by mouth daily as needed. For acid reflux      . lisinopril (PRINIVIL,ZESTRIL) 5 MG tablet Take 5 mg by mouth daily.       Marland Kitchen LORazepam (ATIVAN) 2 MG tablet Take 2 mg by mouth daily as needed. For anxiety/sleep      . mirtazapine (REMERON) 30 MG tablet Take 30 mg by mouth at bedtime.       No current facility-administered medications on file prior to visit.        Objective:   Physical Exam  Filed Vitals:   11/29/13 1404  BP: 132/70  Pulse: 60  Temp: 98.6 F (37 C)  Height: 5\' 11"  (1.803 m)  Weight: 209 lb 8 oz (95.029 kg)   Alert and oriented. Skin warm and dry. Oral mucosa is moist.   . Sclera anicteric, conjunctivae is pink. Thyroid not  enlarged. No cervical lymphadenopathy. Lungs clear. Heart regular rate and rhythm.  Abdomen is soft. Bowel sounds are positive. No hepatomegaly. No abdominal masses felt. No tenderness.  No edema to lower extremities.  Stool brown and guaiac negative.        Assessment & Plan:  Personal hx of colonic polyps and he needs surveillance colonoscopy. Constipation: Fiber 4gm. Miralax 1/2 scoop daily to start.  CBC with diff.

## 2013-11-29 NOTE — Telephone Encounter (Signed)
Patient needs trilyte 

## 2013-11-29 NOTE — Patient Instructions (Signed)
Miralax 1/2 scoop daily. Fiber 4gm. Colonoscopy. The risks and benefits such as perforation, bleeding, and infection were reviewed with the patient and is agreeable.

## 2013-12-05 ENCOUNTER — Encounter (HOSPITAL_COMMUNITY): Payer: Self-pay | Admitting: Pharmacy Technician

## 2013-12-07 DIAGNOSIS — E785 Hyperlipidemia, unspecified: Secondary | ICD-10-CM | POA: Diagnosis not present

## 2013-12-17 ENCOUNTER — Telehealth (INDEPENDENT_AMBULATORY_CARE_PROVIDER_SITE_OTHER): Payer: Self-pay | Admitting: *Deleted

## 2013-12-17 DIAGNOSIS — Z1211 Encounter for screening for malignant neoplasm of colon: Secondary | ICD-10-CM

## 2013-12-17 MED ORDER — PEG 3350-KCL-NA BICARB-NACL 420 G PO SOLR: 4000.0000 mL | Freq: Once | ORAL | Status: DC

## 2013-12-17 NOTE — Telephone Encounter (Signed)
Patient needs trilyte -- pharmacy didn't get

## 2013-12-18 ENCOUNTER — Encounter: Payer: Self-pay | Admitting: Cardiovascular Disease

## 2013-12-20 ENCOUNTER — Encounter (HOSPITAL_COMMUNITY)
Admission: RE | Disposition: A | Discharge status: Home / Self Care | Payer: Self-pay | Source: Ambulatory Visit | Attending: Internal Medicine

## 2013-12-20 ENCOUNTER — Encounter (HOSPITAL_COMMUNITY): Payer: Self-pay

## 2013-12-20 ENCOUNTER — Ambulatory Visit (HOSPITAL_COMMUNITY)
Admission: RE | Admit: 2013-12-20 | Discharge: 2013-12-20 | Disposition: A | Discharge status: Home / Self Care | Payer: Medicare Other | Source: Ambulatory Visit | Attending: Internal Medicine | Admitting: Internal Medicine

## 2013-12-20 DIAGNOSIS — F329 Major depressive disorder, single episode, unspecified: Secondary | ICD-10-CM | POA: Insufficient documentation

## 2013-12-20 DIAGNOSIS — G4700 Insomnia, unspecified: Secondary | ICD-10-CM | POA: Insufficient documentation

## 2013-12-20 DIAGNOSIS — Z860100 Personal history of colon polyps, unspecified: Secondary | ICD-10-CM | POA: Diagnosis not present

## 2013-12-20 DIAGNOSIS — K5909 Other constipation: Secondary | ICD-10-CM | POA: Diagnosis not present

## 2013-12-20 DIAGNOSIS — Z7982 Long term (current) use of aspirin: Secondary | ICD-10-CM | POA: Insufficient documentation

## 2013-12-20 DIAGNOSIS — E785 Hyperlipidemia, unspecified: Secondary | ICD-10-CM | POA: Insufficient documentation

## 2013-12-20 DIAGNOSIS — I252 Old myocardial infarction: Secondary | ICD-10-CM | POA: Diagnosis not present

## 2013-12-20 DIAGNOSIS — Z85038 Personal history of other malignant neoplasm of large intestine: Secondary | ICD-10-CM | POA: Diagnosis not present

## 2013-12-20 DIAGNOSIS — F3289 Other specified depressive episodes: Secondary | ICD-10-CM | POA: Diagnosis not present

## 2013-12-20 DIAGNOSIS — M1990 Unspecified osteoarthritis, unspecified site: Secondary | ICD-10-CM | POA: Diagnosis not present

## 2013-12-20 DIAGNOSIS — Z951 Presence of aortocoronary bypass graft: Secondary | ICD-10-CM | POA: Diagnosis not present

## 2013-12-20 DIAGNOSIS — Z87891 Personal history of nicotine dependence: Secondary | ICD-10-CM | POA: Insufficient documentation

## 2013-12-20 DIAGNOSIS — K5900 Constipation, unspecified: Secondary | ICD-10-CM

## 2013-12-20 DIAGNOSIS — Z1211 Encounter for screening for malignant neoplasm of colon: Secondary | ICD-10-CM | POA: Insufficient documentation

## 2013-12-20 DIAGNOSIS — D126 Benign neoplasm of colon, unspecified: Secondary | ICD-10-CM | POA: Insufficient documentation

## 2013-12-20 DIAGNOSIS — I10 Essential (primary) hypertension: Secondary | ICD-10-CM | POA: Insufficient documentation

## 2013-12-20 HISTORY — PX: COLONOSCOPY: SHX5424

## 2013-12-20 SURGERY — COLONOSCOPY
Anesthesia: Moderate Sedation

## 2013-12-20 MED ORDER — MIDAZOLAM HCL 5 MG/5ML IJ SOLN
INTRAMUSCULAR | Status: AC
  Filled 2013-12-20: qty 10

## 2013-12-20 MED ORDER — SODIUM CHLORIDE 0.9 % IV SOLN
INTRAVENOUS | Status: DC
  Administered 2013-12-20: 09:00:00 via INTRAVENOUS

## 2013-12-20 MED ORDER — MEPERIDINE HCL 50 MG/ML IJ SOLN
INTRAMUSCULAR | Status: AC
  Filled 2013-12-20: qty 1

## 2013-12-20 MED ORDER — STERILE WATER FOR IRRIGATION IR SOLN
Status: DC | PRN
  Administered 2013-12-20: 09:00:00

## 2013-12-20 MED ORDER — MIDAZOLAM HCL 5 MG/5ML IJ SOLN
INTRAMUSCULAR | Status: AC
  Filled 2013-12-20: qty 5

## 2013-12-20 MED ORDER — MEPERIDINE HCL 50 MG/ML IJ SOLN
INTRAMUSCULAR | Status: DC | PRN
  Administered 2013-12-20 (×2): 25 mg via INTRAVENOUS

## 2013-12-20 MED ORDER — MIDAZOLAM HCL 5 MG/5ML IJ SOLN
INTRAMUSCULAR | Status: DC | PRN
  Administered 2013-12-20: 3 mg via INTRAVENOUS
  Administered 2013-12-20 (×2): 2 mg via INTRAVENOUS
  Administered 2013-12-20: 3 mg via INTRAVENOUS

## 2013-12-20 NOTE — Discharge Instructions (Signed)
No aspirin or NSAIDs for one week. Resume medications as before. High fiber diet. Metamucil 4 g by mouth daily at bedtime. Physician will call with biopsy results  Colonoscopy, Care After Refer to this sheet in the next few weeks. These instructions provide you with information on caring for yourself after your procedure. Your health care provider may also give you more specific instructions. Your treatment has been planned according to current medical practices, but problems sometimes occur. Call your health care provider if you have any problems or questions after your procedure. WHAT TO EXPECT AFTER THE PROCEDURE  After your procedure, it is typical to have the following:  A small amount of blood in your stool.  Moderate amounts of gas and mild abdominal cramping or bloating. HOME CARE INSTRUCTIONS  Do not drive, operate machinery, or sign important documents for 24 hours.  You may shower and resume your regular physical activities, but move at a slower pace for the first 24 hours.  Take frequent rest periods for the first 24 hours.  Walk around or put a warm pack on your abdomen to help reduce abdominal cramping and bloating.  Drink enough fluids to keep your urine clear or pale yellow.  You may resume your normal diet as instructed by your health care provider. Avoid heavy or fried foods that are hard to digest.  Avoid drinking alcohol for 24 hours or as instructed by your health care provider.  Only take over-the-counter or prescription medicines as directed by your health care provider.  If a tissue sample (biopsy) was taken during your procedure:  Do not take aspirin or blood thinners for 7 days, or as instructed by your health care provider.  Do not drink alcohol for 7 days, or as instructed by your health care provider.  Eat soft foods for the first 24 hours. SEEK MEDICAL CARE IF: You have persistent spotting of blood in your stool 2-3 days after the procedure. SEEK  IMMEDIATE MEDICAL CARE IF:  You have more than a small spotting of blood in your stool.  You pass large blood clots in your stool.  Your abdomen is swollen (distended).  You have nausea or vomiting.  You have a fever.  You have increasing abdominal pain that is not relieved with medicine. Document Released: 11/04/2003 Document Revised: 01/10/2013 Document Reviewed: 11/27/2012 [facility] Patient Information 2015 [place], [place]. This information is not intended to replace advice given to you by your health care provider. Make sure you discuss any questions you have with your health care provider.  High-Fiber Diet Fiber is found in fruits, vegetables, and grains. A high-fiber diet encourages the addition of more whole grains, legumes, fruits, and vegetables in your diet. The recommended amount of fiber for adult males is 38 g per day. For adult females, it is 25 g per day. Pregnant and lactating women should get 28 g of fiber per day. If you have a digestive or bowel problem, ask your caregiver for advice before adding high-fiber foods to your diet. Eat a variety of high-fiber foods instead of only a select few type of foods.  PURPOSE  To increase stool bulk.  To make bowel movements more regular to prevent constipation.  To lower cholesterol.  To prevent overeating. WHEN IS THIS DIET USED?  It may be used if you have constipation and hemorrhoids.  It may be used if you have uncomplicated diverticulosis (intestine condition) and irritable bowel syndrome.  It may be used if you need help with weight  management.  It may be used if you want to add it to your diet as a protective measure against atherosclerosis, diabetes, and cancer. SOURCES OF FIBER  Whole-grain breads and cereals.  Fruits, such as apples, oranges, bananas, berries, prunes, and pears.  Vegetables, such as green peas, carrots, sweet potatoes, beets, broccoli, cabbage, spinach, and artichokes.  Legumes, such split  peas, soy, lentils.  Almonds. FIBER CONTENT IN FOODS Starches and Grains / Dietary Fiber (g)  Cheerios, 1 cup / 3 g  Corn Flakes cereal, 1 cup / 0.7 g  Rice crispy treat cereal, 1 cup / 0.3 g  Instant oatmeal (cooked),  cup / 2 g  Frosted wheat cereal, 1 cup / 5.1 g  Brown, long-grain rice (cooked), 1 cup / 3.5 g  White, long-grain rice (cooked), 1 cup / 0.6 g  Enriched macaroni (cooked), 1 cup / 2.5 g Legumes / Dietary Fiber (g)  Baked beans (canned, plain, or vegetarian),  cup / 5.2 g  Kidney beans (canned),  cup / 6.8 g  Pinto beans (cooked),  cup / 5.5 g Breads and Crackers / Dietary Fiber (g)  Plain or honey graham crackers, 2 squares / 0.7 g  Saltine crackers, 3 squares / 0.3 g  Plain, salted pretzels, 10 pieces / 1.8 g  Whole-wheat bread, 1 slice / 1.9 g  White bread, 1 slice / 0.7 g  Raisin bread, 1 slice / 1.2 g  Plain bagel, 3 oz / 2 g  Flour tortilla, 1 oz / 0.9 g  Corn tortilla, 1 small / 1.5 g  Hamburger or hotdog bun, 1 small / 0.9 g Fruits / Dietary Fiber (g)  Apple with skin, 1 medium / 4.4 g  Sweetened applesauce,  cup / 1.5 g  Banana,  medium / 1.5 g  Grapes, 10 grapes / 0.4 g  Orange, 1 small / 2.3 g  Raisin, 1.5 oz / 1.6 g  Melon, 1 cup / 1.4 g Vegetables / Dietary Fiber (g)  Green beans (canned),  cup / 1.3 g  Carrots (cooked),  cup / 2.3 g  Broccoli (cooked),  cup / 2.8 g  Peas (cooked),  cup / 4.4 g  Mashed potatoes,  cup / 1.6 g  Lettuce, 1 cup / 0.5 g  Corn (canned),  cup / 1.6 g  Tomato,  cup / 1.1 g Document Released: 03/22/2005 Document Revised: 09/21/2011 Document Reviewed: 06/24/2011 [employer] Patient Information 2015 [place], [place]. This information is not intended to replace advice given to you by your health care provider. Make sure you discuss any questions you have with your health care provider.

## 2013-12-20 NOTE — Op Note (Addendum)
COLONOSCOPY PROCEDURE REPORT  PATIENT:  Rodney Decker  MR#:  [id number] Birthdate:  05-15-1940, 73 y.o., male Endoscopist:  Dr. Rogene Houston, MD Referred By:  Dr. Sherrilee Gilles. Gerarda Fraction, MD Procedure Date: 12/20/2013  Procedure:   Colonoscopy with snare polypectomy.  Indications:  Patient is a 73 year old Caucasian male with history of colonic adenomas and sessile serrated polyps. His last exam was in December 2012. He was advised to return in one year but he did not. He has no GI complaints other than constipation. History is negative for CRC.  Informed Consent:  The procedure and risks were reviewed with the patient and informed consent was obtained.  Medications:  Demerol 50 mg IV Versed 10 mg IV  Description of procedure:  After a digital rectal exam was performed, that colonoscope was advanced from the anus through the rectum and colon to the area of the cecum, ileocecal valve and appendiceal orifice. The cecum was deeply intubated. These structures were well-seen and photographed for the record. From the level of the cecum and ileocecal valve, the scope was slowly and cautiously withdrawn. The mucosal surfaces were carefully surveyed utilizing scope tip to flexion to facilitate fold flattening as needed. The scope was pulled down into the rectum where a thorough exam including retroflexion was performed.  Findings:   Prep fair to satisfactory. 10 mm polyp hot snared from hepatic flexure and broke into pieces on retrieval. Polypectomy complete. Four small polyps were hot snared from Transverse colon. One was lost. Others are submitted together. 10 mm polyp hot snared from sigmoid colon. Three small polyps were snared from sigmoid colon and submitted together(two were cold snared and one hot snared). Normal rectal mucosa and anorectal junction.   Therapeutic/Diagnostic Maneuvers Performed:  See above  Complications:  None  Cecal Withdrawal Time:  24 minutes  Impression:   Examination performed the cecum. All in all patient had 9 polyps. 10 mm polyp hot snared from hepatic flexure. 10 mm polyp hot snared from sigmoid colon. Four small polyps were snared as above and submitted together(one was lost). Three small polyps was ablated from sigmoid colon as above and submitted together.  Recommendations:  Standard instructions given. No aspirin or NSAIDs for one week. I will contact patient with biopsy results and further recommendations.  Zubin Pontillo U  12/20/2013 10:18 AM  CC: Dr. Glo Herring., MD & Dr. Rayne Du ref. provider found

## 2013-12-20 NOTE — H&P (Addendum)
Rodney Decker is an 73 y.o. male.   Chief Complaint: Patient is here for colonoscopy. HPI: Patient is a regional Caucasian male with history of colonic adenomas as well as rate and Rodney Decker is here for surveillance colonoscopy. His last exam was in December 2012. He denies abdominal pain or rectal bleeding. He has chronic constipation. He states he is not getting the side of the results. He reports a painful area close to xiphisternum which she's had since his cardiac surgery. This was felt to be a reaction to suture material. Family history is negative for CRC.  Past Medical History  Diagnosis Date  . Hypertension   . CAD (coronary artery disease)   . Insomnia   . Depression   . Colon polyps   . GERD (gastroesophageal reflux disease)   . Dyslipidemia   . Myocardial infarction 2011  . Arthritis   . Ruptured lumbar disc     Past Surgical History  Procedure Laterality Date  . Colonoscopy 10/10/2001 for surveillqance for colonic polyps.      Biopsy revealed hyperplastic polyps. Next colonoscopy in 3 yrs.  Marland Kitchen Appendectomy    . Colonoscopy  03/24/2011    Procedure: COLONOSCOPY;  Surgeon: Rodney Houston, MD;  [facility];  Service: Endoscopy;  Laterality: N/A;  9:30  . Cardiac surgery    . Cardiac catheterization  04/17/2009    referred for CABG  . Coronary artery bypass graft  2011    x5   . Hernia repair      left inguinal- Dr Rodney Decker  . Cataract extraction w/phaco Right 08/13/2013    Procedure: CATARACT EXTRACTION PHACO AND INTRAOCULAR LENS PLACEMENT (IOC);  Surgeon: Rodney Branch, MD;  Location: [facility];  Service: Ophthalmology;  Laterality: Right;  CDE 7.11  . Cataract extraction w/phaco Left 09/06/2013    Procedure: CATARACT EXTRACTION PHACO AND INTRAOCULAR LENS PLACEMENT (IOC);  Surgeon: Rodney Branch, MD;  Location: [facility];  Service: Ophthalmology;  Laterality: Left;  CDE: 9.87    Family History  Problem Relation Age of Onset  . Colon cancer Neg Hx    Social  History:  reports that he quit smoking about 8 years ago. His smoking use included Cigarettes. He has a 20 pack-year smoking history. He has never used smokeless tobacco. He reports that he does not drink alcohol or use illicit drugs.  Allergies:  Allergies  Allergen Reactions  . Claritin [Loratadine] Other (See Comments)    unknown  . Cymbalta [Duloxetine Hcl] Rash    Medications Prior to Admission  Medication Sig Dispense Refill  . aspirin 81 MG tablet Take 81 mg by mouth daily.      Marland Kitchen atorvastatin (LIPITOR) 40 MG tablet Take 1 tablet (40 mg total) by mouth daily.  30 tablet  6  . buPROPion (WELLBUTRIN) 100 MG tablet Take 100 mg by mouth 3 (three) times daily.      Marland Kitchen esomeprazole (NEXIUM) 40 MG capsule Take 40 mg by mouth daily as needed. For acid reflux      . lisinopril (PRINIVIL,ZESTRIL) 5 MG tablet Take 5 mg by mouth daily.       Marland Kitchen LORazepam (ATIVAN) 2 MG tablet Take 2 mg by mouth daily as needed. For anxiety/sleep      . mirtazapine (REMERON) 30 MG tablet Take 30 mg by mouth at bedtime.      . polyethylene glycol-electrolytes (NULYTELY/GOLYTELY) 420 G solution Take 4,000 mLs by mouth once.  4000 mL  0  . traMADol (ULTRAM)  50 MG tablet Take by mouth every 6 (six) hours as needed.        No results found for this or any previous visit (from the past 48 hour(s)). No results found.  ROS  Blood pressure 129/84, pulse 68, temperature 97.8 F (36.6 C), temperature source Oral, resp. rate 14, SpO2 95.00%. Physical Exam  Constitutional: He appears well-developed and well-nourished.  HENT:  Mouth/Throat: Oropharynx is clear and moist.  Eyes: Conjunctivae are normal. No scleral icterus.  Neck: No thyromegaly present.  Cardiovascular: Normal rate, regular rhythm and normal heart sounds.   No murmur heard. Respiratory: Effort normal and breath sounds normal.  GI: Soft. He exhibits no distension and no mass. There is no tenderness.  Painful nodule to the left of scar close to  xiphisternum  Musculoskeletal: He exhibits no edema.  Lymphadenopathy:    He has no cervical adenopathy.  Neurological: He is alert.  Skin: Skin is warm and dry.     Assessment/Plan History of colonic adenomas and sessile serrated polyps. Surveillance colonoscopy.  Rodney Decker 12/20/2013, 9:15 AM

## 2013-12-21 ENCOUNTER — Encounter (HOSPITAL_COMMUNITY): Payer: Self-pay | Admitting: Internal Medicine

## 2014-01-10 ENCOUNTER — Encounter (INDEPENDENT_AMBULATORY_CARE_PROVIDER_SITE_OTHER): Payer: Self-pay | Admitting: *Deleted

## 2014-01-23 DIAGNOSIS — G894 Chronic pain syndrome: Secondary | ICD-10-CM | POA: Diagnosis not present

## 2014-01-23 DIAGNOSIS — Z23 Encounter for immunization: Secondary | ICD-10-CM | POA: Diagnosis not present

## 2014-01-23 DIAGNOSIS — Z6832 Body mass index (BMI) 32.0-32.9, adult: Secondary | ICD-10-CM | POA: Diagnosis not present

## 2014-01-23 DIAGNOSIS — E6609 Other obesity due to excess calories: Secondary | ICD-10-CM | POA: Diagnosis not present

## 2014-01-23 DIAGNOSIS — F329 Major depressive disorder, single episode, unspecified: Secondary | ICD-10-CM | POA: Diagnosis not present

## 2014-02-04 NOTE — Progress Notes (Signed)
This encounter was created in error - please disregard.

## 2014-02-14 DIAGNOSIS — R3919 Other difficulties with micturition: Secondary | ICD-10-CM | POA: Diagnosis not present

## 2014-02-14 DIAGNOSIS — I10 Essential (primary) hypertension: Secondary | ICD-10-CM | POA: Diagnosis not present

## 2014-02-14 DIAGNOSIS — Z681 Body mass index (BMI) 19 or less, adult: Secondary | ICD-10-CM | POA: Diagnosis not present

## 2014-03-11 DIAGNOSIS — R635 Abnormal weight gain: Secondary | ICD-10-CM | POA: Diagnosis not present

## 2014-03-11 DIAGNOSIS — Z6832 Body mass index (BMI) 32.0-32.9, adult: Secondary | ICD-10-CM | POA: Diagnosis not present

## 2014-03-11 DIAGNOSIS — G894 Chronic pain syndrome: Secondary | ICD-10-CM | POA: Diagnosis not present

## 2014-03-11 DIAGNOSIS — F419 Anxiety disorder, unspecified: Secondary | ICD-10-CM | POA: Diagnosis not present

## 2014-04-24 DIAGNOSIS — G894 Chronic pain syndrome: Secondary | ICD-10-CM | POA: Diagnosis not present

## 2014-04-24 DIAGNOSIS — E6609 Other obesity due to excess calories: Secondary | ICD-10-CM | POA: Diagnosis not present

## 2014-04-24 DIAGNOSIS — Z6831 Body mass index (BMI) 31.0-31.9, adult: Secondary | ICD-10-CM | POA: Diagnosis not present

## 2014-05-21 DIAGNOSIS — G894 Chronic pain syndrome: Secondary | ICD-10-CM | POA: Diagnosis not present

## 2014-05-21 DIAGNOSIS — E6609 Other obesity due to excess calories: Secondary | ICD-10-CM | POA: Diagnosis not present

## 2014-05-21 DIAGNOSIS — Z6832 Body mass index (BMI) 32.0-32.9, adult: Secondary | ICD-10-CM | POA: Diagnosis not present

## 2014-05-29 ENCOUNTER — Encounter: Payer: Self-pay | Admitting: Cardiovascular Disease

## 2014-05-29 ENCOUNTER — Ambulatory Visit (INDEPENDENT_AMBULATORY_CARE_PROVIDER_SITE_OTHER): Payer: Medicare Other | Admitting: Cardiovascular Disease

## 2014-05-29 VITALS — BP 122/70 | HR 80 | Ht 71.0 in | Wt 203.0 lb

## 2014-05-29 DIAGNOSIS — I25812 Atherosclerosis of bypass graft of coronary artery of transplanted heart without angina pectoris: Secondary | ICD-10-CM

## 2014-05-29 DIAGNOSIS — E782 Mixed hyperlipidemia: Secondary | ICD-10-CM | POA: Diagnosis not present

## 2014-05-29 DIAGNOSIS — I10 Essential (primary) hypertension: Secondary | ICD-10-CM

## 2014-05-29 DIAGNOSIS — R109 Unspecified abdominal pain: Secondary | ICD-10-CM

## 2014-05-29 MED ORDER — NITROGLYCERIN 0.4 MG SL SUBL: 0.4000 mg | SUBLINGUAL_TABLET | SUBLINGUAL | Status: DC | PRN

## 2014-05-29 NOTE — Progress Notes (Signed)
Patient ID: HERCHEL HOPKIN, male   DOB: 08-07-1940, 74 y.o.   MRN: [id number]      SUBJECTIVE: Mr. 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 He asks for frequent testing due to his anxiety. He also has a history of GERD and GI-related symptoms. He has a history of colonic adenomas and sessile serrated polyps. He primarily complains of abdominal discomfort after eating. He is taking aspirin 81 mg daily. He has old sublingual nitroglycerin from 2 years ago.   Review of Systems: As per "subjective", otherwise negative.  Allergies  Allergen Reactions  . Claritin [Loratadine] Other (See Comments)    unknown  . Cymbalta [Duloxetine Hcl] Rash    Current Outpatient Prescriptions  Medication Sig Dispense Refill  . atorvastatin (LIPITOR) 40 MG tablet Take 1 tablet (40 mg total) by mouth daily. 30 tablet 6  . buPROPion (WELLBUTRIN) 100 MG tablet Take 100 mg by mouth 2 (two) times daily.     Marland Kitchen esomeprazole (NEXIUM) 40 MG capsule Take 40 mg by mouth daily as needed. For acid reflux    . lisinopril (PRINIVIL,ZESTRIL) 5 MG tablet Take 5 mg by mouth daily.     Marland Kitchen LORazepam (ATIVAN) 2 MG tablet Take 2 mg by mouth daily as needed. For anxiety/sleep    . traMADol (ULTRAM) 50 MG tablet Take by mouth every 6 (six) hours as needed.     No current facility-administered medications for this visit.    Past Medical History  Diagnosis Date  . Hypertension   . CAD (coronary artery disease)   . Insomnia   . Depression   . Colon polyps   . GERD (gastroesophageal reflux disease)   . Dyslipidemia   . Myocardial infarction 2011  . Arthritis   . Ruptured lumbar disc     Past Surgical History  Procedure Laterality Date  . Colonoscopy 10/10/2001 for surveillqance for colonic polyps.      Biopsy revealed hyperplastic polyps. Next colonoscopy in 3 yrs.  Marland Kitchen Appendectomy     . Colonoscopy  03/24/2011    Procedure: COLONOSCOPY;  Surgeon: Rogene Houston, MD;  [facility];  Service: Endoscopy;  Laterality: N/A;  9:30  . Cardiac surgery    . Cardiac catheterization  04/17/2009    referred for CABG  . Coronary artery bypass graft  2011    x5   . Hernia repair      left inguinal- Dr Romona Curls  . Cataract extraction w/phaco Right 08/13/2013    Procedure: CATARACT EXTRACTION PHACO AND INTRAOCULAR LENS PLACEMENT (IOC);  Surgeon: Tonny Branch, MD;  Location: [facility];  Service: Ophthalmology;  Laterality: Right;  CDE 7.11  . Cataract extraction w/phaco Left 09/06/2013    Procedure: CATARACT EXTRACTION PHACO AND INTRAOCULAR LENS PLACEMENT (IOC);  Surgeon: Tonny Branch, MD;  Location: [facility];  Service: Ophthalmology;  Laterality: Left;  CDE: 9.87  . Colonoscopy N/A 12/20/2013    Procedure: COLONOSCOPY;  Surgeon: Rogene Houston, MD;  [facility]: [facility];  Service: Endoscopy;  Laterality: N/A;  930    History   Social History  . Marital Status: Single    Spouse Name: N/A  . Number of Children: 0  . Years of Education: N/A   Occupational History  .     Social History Main Topics  . Smoking status: Former Smoker -- 0.50 packs/day for 40 years  Types: Cigarettes    Start date: 05/29/1961    Quit date: 03/14/2005  . Smokeless tobacco: Never Used     Comment: Patient states that he smoked 10 cigarettes a day  . Alcohol Use: No  . Drug Use: No  . Sexual Activity: Yes    Birth Control/ Protection: None   Other Topics Concern  . Not on file   Social History Narrative     Filed Vitals:   05/29/14 1154  BP: 122/70  Pulse: 80  Height: 5\' 11"  (1.803 m)  Weight: 203 lb (92.08 kg)  SpO2: 97%    PHYSICAL EXAM General: NAD HEENT: Normal. Neck: No JVD, no thyromegaly. Lungs: Clear to auscultation bilaterally with normal respiratory effort. CV: Nondisplaced PMI.  Regular rate and rhythm, normal S1/S2, no S3/S4, no murmur. No pretibial or  periankle edema.  No carotid bruit.  Normal pedal pulses.  Abdomen: Soft, nontender, no hepatosplenomegaly, no distention.  Neurologic: Alert and oriented x 3.  Psych: Normal affect. Skin: Normal. Musculoskeletal: Normal range of motion, no gross deformities. Extremities: No clubbing or cyanosis.   ECG: Most recent ECG reviewed.      ASSESSMENT AND PLAN: 1. CAD with history of CABG: Symptomatically stable. Continue ASA 81 mg daily along with Lipitor 40 mg. Will refill SL nitroglycerin. 2. Essential HTN: Well controlled on lisinopril 5 mg daily. No changes. 3. Hyperlipidemia: Will obtain copy of lipids from PCP. Continue Lipitor 40 mg. 4. Abdominal pain after eating: Recommend he f/u with GI.  Dispo: f/u 1 year.   Kate Sable, M.D., F.A.C.C.

## 2014-05-29 NOTE — Patient Instructions (Addendum)
Your physician wants you to follow-up in: 1 year with Jory Sims NP You will receive a reminder letter in the mail two months in advance. If you don't receive a letter, please call our office to schedule the follow-up appointment.    Your physician recommends that you continue on your current medications as directed. Please refer to the Current Medication list given to you today.     I refilled your Nitroglycerine     Thank you for choosing [facility] !

## 2014-06-13 ENCOUNTER — Other Ambulatory Visit (HOSPITAL_COMMUNITY): Payer: Self-pay | Admitting: Internal Medicine

## 2014-06-13 ENCOUNTER — Ambulatory Visit (HOSPITAL_COMMUNITY): Payer: Federal, State, Local not specified - PPO

## 2014-06-13 DIAGNOSIS — G894 Chronic pain syndrome: Secondary | ICD-10-CM | POA: Diagnosis not present

## 2014-06-13 DIAGNOSIS — R059 Cough, unspecified: Secondary | ICD-10-CM

## 2014-06-13 DIAGNOSIS — F419 Anxiety disorder, unspecified: Secondary | ICD-10-CM | POA: Diagnosis not present

## 2014-06-13 DIAGNOSIS — E6609 Other obesity due to excess calories: Secondary | ICD-10-CM | POA: Diagnosis not present

## 2014-06-13 DIAGNOSIS — R05 Cough: Secondary | ICD-10-CM

## 2014-06-13 DIAGNOSIS — Z6831 Body mass index (BMI) 31.0-31.9, adult: Secondary | ICD-10-CM | POA: Diagnosis not present

## 2014-06-19 ENCOUNTER — Ambulatory Visit (HOSPITAL_COMMUNITY)
Admission: RE | Admit: 2014-06-19 | Discharge: 2014-06-19 | Disposition: A | Discharge status: Home / Self Care | Payer: Medicare Other | Source: Ambulatory Visit | Attending: Internal Medicine | Admitting: Internal Medicine

## 2014-06-19 DIAGNOSIS — R05 Cough: Secondary | ICD-10-CM | POA: Insufficient documentation

## 2014-06-19 DIAGNOSIS — Z79899 Other long term (current) drug therapy: Secondary | ICD-10-CM | POA: Diagnosis not present

## 2014-06-19 DIAGNOSIS — R911 Solitary pulmonary nodule: Secondary | ICD-10-CM | POA: Diagnosis not present

## 2014-06-19 DIAGNOSIS — R059 Cough, unspecified: Secondary | ICD-10-CM

## 2014-07-22 DIAGNOSIS — Z6830 Body mass index (BMI) 30.0-30.9, adult: Secondary | ICD-10-CM | POA: Diagnosis not present

## 2014-07-22 DIAGNOSIS — E6609 Other obesity due to excess calories: Secondary | ICD-10-CM | POA: Diagnosis not present

## 2014-07-22 DIAGNOSIS — N529 Male erectile dysfunction, unspecified: Secondary | ICD-10-CM | POA: Diagnosis not present

## 2014-07-22 DIAGNOSIS — G894 Chronic pain syndrome: Secondary | ICD-10-CM | POA: Diagnosis not present

## 2014-08-26 DIAGNOSIS — I2510 Atherosclerotic heart disease of native coronary artery without angina pectoris: Secondary | ICD-10-CM | POA: Diagnosis not present

## 2014-08-26 DIAGNOSIS — Z6830 Body mass index (BMI) 30.0-30.9, adult: Secondary | ICD-10-CM | POA: Diagnosis not present

## 2014-08-26 DIAGNOSIS — G894 Chronic pain syndrome: Secondary | ICD-10-CM | POA: Diagnosis not present

## 2014-08-26 DIAGNOSIS — I10 Essential (primary) hypertension: Secondary | ICD-10-CM | POA: Diagnosis not present

## 2014-08-26 DIAGNOSIS — F419 Anxiety disorder, unspecified: Secondary | ICD-10-CM | POA: Diagnosis not present

## 2014-10-03 DIAGNOSIS — Z1389 Encounter for screening for other disorder: Secondary | ICD-10-CM | POA: Diagnosis not present

## 2014-10-03 DIAGNOSIS — G894 Chronic pain syndrome: Secondary | ICD-10-CM | POA: Diagnosis not present

## 2014-10-03 DIAGNOSIS — F419 Anxiety disorder, unspecified: Secondary | ICD-10-CM | POA: Diagnosis not present

## 2014-10-03 DIAGNOSIS — Z6829 Body mass index (BMI) 29.0-29.9, adult: Secondary | ICD-10-CM | POA: Diagnosis not present

## 2014-10-03 DIAGNOSIS — E663 Overweight: Secondary | ICD-10-CM | POA: Diagnosis not present

## 2014-10-15 DIAGNOSIS — T6391XA Toxic effect of contact with unspecified venomous animal, accidental (unintentional), initial encounter: Secondary | ICD-10-CM | POA: Diagnosis not present

## 2014-10-15 DIAGNOSIS — R1902 Left upper quadrant abdominal swelling, mass and lump: Secondary | ICD-10-CM | POA: Diagnosis not present

## 2014-10-15 DIAGNOSIS — Z1389 Encounter for screening for other disorder: Secondary | ICD-10-CM | POA: Diagnosis not present

## 2014-10-15 DIAGNOSIS — Z6829 Body mass index (BMI) 29.0-29.9, adult: Secondary | ICD-10-CM | POA: Diagnosis not present

## 2014-10-15 DIAGNOSIS — E663 Overweight: Secondary | ICD-10-CM | POA: Diagnosis not present

## 2014-10-16 ENCOUNTER — Other Ambulatory Visit (HOSPITAL_COMMUNITY): Payer: Self-pay | Admitting: Family Medicine

## 2014-10-16 DIAGNOSIS — R1902 Left upper quadrant abdominal swelling, mass and lump: Secondary | ICD-10-CM

## 2014-10-21 ENCOUNTER — Ambulatory Visit (HOSPITAL_COMMUNITY)
Admission: RE | Admit: 2014-10-21 | Discharge: 2014-10-21 | Disposition: A | Discharge status: Home / Self Care | Payer: Medicare Other | Source: Ambulatory Visit | Attending: Family Medicine | Admitting: Family Medicine

## 2014-10-21 DIAGNOSIS — R109 Unspecified abdominal pain: Secondary | ICD-10-CM | POA: Diagnosis not present

## 2014-10-21 DIAGNOSIS — R1902 Left upper quadrant abdominal swelling, mass and lump: Secondary | ICD-10-CM

## 2014-10-30 ENCOUNTER — Other Ambulatory Visit (HOSPITAL_COMMUNITY): Payer: Self-pay | Admitting: Internal Medicine

## 2014-10-30 DIAGNOSIS — F419 Anxiety disorder, unspecified: Secondary | ICD-10-CM | POA: Diagnosis not present

## 2014-10-30 DIAGNOSIS — Z1389 Encounter for screening for other disorder: Secondary | ICD-10-CM | POA: Diagnosis not present

## 2014-10-30 DIAGNOSIS — G894 Chronic pain syndrome: Secondary | ICD-10-CM | POA: Diagnosis not present

## 2014-10-30 DIAGNOSIS — R1902 Left upper quadrant abdominal swelling, mass and lump: Secondary | ICD-10-CM | POA: Diagnosis not present

## 2014-10-30 DIAGNOSIS — E6609 Other obesity due to excess calories: Secondary | ICD-10-CM | POA: Diagnosis not present

## 2014-11-01 ENCOUNTER — Other Ambulatory Visit (HOSPITAL_COMMUNITY): Payer: Self-pay | Admitting: Internal Medicine

## 2014-11-01 ENCOUNTER — Ambulatory Visit (HOSPITAL_COMMUNITY)
Admission: RE | Admit: 2014-11-01 | Discharge: 2014-11-01 | Disposition: A | Discharge status: Home / Self Care | Payer: Medicare Other | Source: Ambulatory Visit | Attending: Internal Medicine | Admitting: Internal Medicine

## 2014-11-01 DIAGNOSIS — R1902 Left upper quadrant abdominal swelling, mass and lump: Secondary | ICD-10-CM

## 2014-11-01 DIAGNOSIS — R222 Localized swelling, mass and lump, trunk: Secondary | ICD-10-CM | POA: Diagnosis not present

## 2014-11-01 DIAGNOSIS — R931 Abnormal findings on diagnostic imaging of heart and coronary circulation: Secondary | ICD-10-CM | POA: Diagnosis not present

## 2014-11-11 DIAGNOSIS — X32XXXD Exposure to sunlight, subsequent encounter: Secondary | ICD-10-CM | POA: Diagnosis not present

## 2014-11-11 DIAGNOSIS — L821 Other seborrheic keratosis: Secondary | ICD-10-CM | POA: Diagnosis not present

## 2014-11-11 DIAGNOSIS — B079 Viral wart, unspecified: Secondary | ICD-10-CM | POA: Diagnosis not present

## 2014-11-11 DIAGNOSIS — L820 Inflamed seborrheic keratosis: Secondary | ICD-10-CM | POA: Diagnosis not present

## 2014-11-11 DIAGNOSIS — L570 Actinic keratosis: Secondary | ICD-10-CM | POA: Diagnosis not present

## 2014-11-21 ENCOUNTER — Other Ambulatory Visit: Payer: Self-pay

## 2014-11-21 DIAGNOSIS — Z1389 Encounter for screening for other disorder: Secondary | ICD-10-CM | POA: Diagnosis not present

## 2014-11-21 DIAGNOSIS — R0789 Other chest pain: Secondary | ICD-10-CM

## 2014-11-21 DIAGNOSIS — I10 Essential (primary) hypertension: Secondary | ICD-10-CM | POA: Diagnosis not present

## 2014-11-21 DIAGNOSIS — N400 Enlarged prostate without lower urinary tract symptoms: Secondary | ICD-10-CM | POA: Diagnosis not present

## 2014-11-21 DIAGNOSIS — Z951 Presence of aortocoronary bypass graft: Secondary | ICD-10-CM

## 2014-11-21 DIAGNOSIS — F419 Anxiety disorder, unspecified: Secondary | ICD-10-CM | POA: Diagnosis not present

## 2014-11-21 DIAGNOSIS — Z6830 Body mass index (BMI) 30.0-30.9, adult: Secondary | ICD-10-CM | POA: Diagnosis not present

## 2014-11-21 DIAGNOSIS — E6609 Other obesity due to excess calories: Secondary | ICD-10-CM | POA: Diagnosis not present

## 2014-12-11 ENCOUNTER — Ambulatory Visit
Admission: RE | Admit: 2014-12-11 | Discharge: 2014-12-11 | Disposition: A | Discharge status: Home / Self Care | Payer: Medicare Other | Source: Ambulatory Visit | Attending: Cardiothoracic Surgery | Admitting: Cardiothoracic Surgery

## 2014-12-11 ENCOUNTER — Ambulatory Visit: Payer: Medicare Other | Admitting: Cardiothoracic Surgery

## 2014-12-11 DIAGNOSIS — J439 Emphysema, unspecified: Secondary | ICD-10-CM | POA: Diagnosis not present

## 2014-12-11 DIAGNOSIS — R05 Cough: Secondary | ICD-10-CM | POA: Diagnosis not present

## 2014-12-11 DIAGNOSIS — R079 Chest pain, unspecified: Secondary | ICD-10-CM | POA: Diagnosis not present

## 2014-12-11 DIAGNOSIS — R0789 Other chest pain: Secondary | ICD-10-CM

## 2014-12-11 DIAGNOSIS — Z951 Presence of aortocoronary bypass graft: Secondary | ICD-10-CM

## 2014-12-12 ENCOUNTER — Ambulatory Visit (INDEPENDENT_AMBULATORY_CARE_PROVIDER_SITE_OTHER): Payer: Medicare Other | Admitting: Cardiothoracic Surgery

## 2014-12-12 ENCOUNTER — Encounter: Payer: Self-pay | Admitting: Cardiothoracic Surgery

## 2014-12-12 ENCOUNTER — Ambulatory Visit: Payer: Medicare Other | Admitting: Cardiothoracic Surgery

## 2014-12-12 VITALS — BP 116/74 | HR 79 | Resp 18 | Ht 71.0 in | Wt 195.0 lb

## 2014-12-12 DIAGNOSIS — Z951 Presence of aortocoronary bypass graft: Secondary | ICD-10-CM | POA: Diagnosis not present

## 2014-12-12 DIAGNOSIS — I25812 Atherosclerosis of bypass graft of coronary artery of transplanted heart without angina pectoris: Secondary | ICD-10-CM | POA: Diagnosis not present

## 2014-12-12 NOTE — Progress Notes (Signed)
PCP is Glo Herring., MD Referring Provider is Redmond School, MD  Chief Complaint  Patient presents with  . Follow-up    s/p CABG 2011.."feels wires moving"...CT CHEST    [id number] returns for evaluation of sternal incision symptoms 5 years after multivessel CABG x5 for unstable angina. The patient denies angina or symptoms of CHF.his preoperative LVEF was normal. He is gaining weight since surgery and has developed a popping sensation in his lower sternal incision which is probably related to a deformity of his xiphoid process and could be related to a fracture of his lower sternal wire. There is also evidence of a small epigastric hernia 1-2 fingers in diameter in this area. Otherwise the upper portion of the sternum is stable and asymptomatic.  The patient's symptoms related to his lower sternal area. The recurrent and fairly chronic and have significant negative impact on his quality of life. I feel it is reasonable to schedule him for revision of the lower sternal closure, removal of the broken wire and repair of the small epigastric hernia.  I reviewed the CT scan of the chest personally to evaluate the status of his sternal closure and the pulmonary windows.   Past Medical History  Diagnosis Date  . Hypertension   . CAD (coronary artery disease)   . Insomnia   . Depression   . Colon polyps   . GERD (gastroesophageal reflux disease)   . Dyslipidemia   . Myocardial infarction 2011  . Arthritis   . Ruptured lumbar disc     Past Surgical History  Procedure Laterality Date  . Colonoscopy 10/10/2001 for surveillqance for colonic polyps.      Biopsy revealed hyperplastic polyps. Next colonoscopy in 3 yrs.  Marland Kitchen Appendectomy    . Colonoscopy  03/24/2011    Procedure: COLONOSCOPY;  Surgeon: Rogene Houston, MD;  [facility];  Service: Endoscopy;  Laterality: N/A;  9:30  . Cardiac surgery    . Cardiac catheterization  04/17/2009    referred for CABG  . Coronary  artery bypass graft  2011    x5   . Hernia repair      left inguinal- Dr Romona Curls  . Cataract extraction w/phaco Right 08/13/2013    Procedure: CATARACT EXTRACTION PHACO AND INTRAOCULAR LENS PLACEMENT (IOC);  Surgeon: Tonny Branch, MD;  Location: [facility];  Service: Ophthalmology;  Laterality: Right;  CDE 7.11  . Cataract extraction w/phaco Left 09/06/2013    Procedure: CATARACT EXTRACTION PHACO AND INTRAOCULAR LENS PLACEMENT (IOC);  Surgeon: Tonny Branch, MD;  Location: [facility];  Service: Ophthalmology;  Laterality: Left;  CDE: 9.87  . Colonoscopy N/A 12/20/2013    Procedure: COLONOSCOPY;  Surgeon: Rogene Houston, MD;  [facility]: [facility];  Service: Endoscopy;  Laterality: N/A;  930    Family History  Problem Relation Age of Onset  . Colon cancer Neg Hx     Social History Social History  Substance Use Topics  . Smoking status: Former Smoker -- 0.50 packs/day for 40 years    Types: Cigarettes    Start date: 05/29/1961    Quit date: 03/14/2005  . Smokeless tobacco: Never Used     Comment: Patient states that he smoked 10 cigarettes a day  . Alcohol Use: No    Current Outpatient Prescriptions  Medication Sig Dispense Refill  . aspirin EC 81 MG tablet Take 81 mg by mouth daily.    Marland Kitchen atorvastatin (LIPITOR) 40 MG tablet Take 1 tablet (40 mg total) by mouth  daily. 30 tablet 6  . buPROPion (WELLBUTRIN) 100 MG tablet Take 100 mg by mouth 2 (two) times daily.     Marland Kitchen lisinopril (PRINIVIL,ZESTRIL) 5 MG tablet Take 5 mg by mouth daily.     Marland Kitchen LORazepam (ATIVAN) 2 MG tablet Take 2 mg by mouth daily as needed. For anxiety/sleep    . nitroGLYCERIN (NITROSTAT) 0.4 MG SL tablet Place 1 tablet (0.4 mg total) under the tongue every 5 (five) minutes as needed for chest pain. 25 tablet 3  . promethazine (PHENERGAN) 25 MG tablet Take 25 mg by mouth every 6 (six) hours as needed for nausea or vomiting.    . traMADol (ULTRAM) 50 MG tablet Take by mouth every 6 (six) hours as needed.     No current  facility-administered medications for this visit.    Allergies  Allergen Reactions  . Claritin [Loratadine] Other (See Comments)    unknown  . Cymbalta [Duloxetine Hcl] Rash    Review of Systems         Review of Systems :  [ y ] = yes, [  ] = no        General :  Weight gain [   yes]    Weight loss  [   ]  Fatigue [  ]  Fever [  ]  Chills  [  ]                                Weakness  [  ]           Cardiac :  Some sharp pain in chest when he bends over or stretches out her hand above head. His pain is located at the lower sternal area. Chest pain/ pressure [no  ]  Resting SOB [  ] exertional SOB [ no ]                        Orthopnea [  ]  Pedal edema  [ mild ]  Palpitations [  ] Syncope/presyncope [ ]                         Paroxysmal nocturnal dyspnea [  ]        Pulmonary : cough [  ]  wheezing [  ]  Hemoptysis [  ] Sputum [  ] Snoring [  ]                              Pneumothorax [  ]  Sleep apnea [  ]       GI : Vomiting [  ]  Dysphagia [  ]  Melena  [  ]  Abdominal pain [chronic constipation problems  ] BRBPR [  ] positive history of colon polyps-all benign              Heart burn [  ]  Constipation [ yes ] Diarrhea  [  ] Colonoscopy [  ]       GU : Hematuria [  ]  Dysuria [  ]  Nocturia [  ] UTI's [  ]       Vascular : Claudication [  ]  Rest pain [  ]  DVT [  ] Vein stripping [  ] leg ulcers [  ]  TIA [  ] Stroke [  ]  Varicose veins [  ]       NEURO :  Headaches  [  ] Seizures [  ] Vision changes [  ] Paresthesias [  ]       Musculoskeletal :  Arthritis [  ] Gout  [  ]  Back pain [  ]  Joint pain [  ]       Skin :  Rash [  ]  Melanoma [  ]        Heme : Bleeding problems [  ]Clotting Disorders [  ] Anemia [  ]Blood Transfusion [ ]        Endocrine : Diabetes [  ] Thyroid Disorder  [  ]       Psych : Depression [yes]  Anxiety [place]  ]  Psych hospitalizations [  ]                                               BP 116/74 mmHg  Pulse 79  Resp 18   Ht 5\' 11"  (1.803 m)  Wt 195 lb (88.451 kg)  BMI 27.21 kg/m2  SpO2 96% Physical Exam       Physical Exam  General: middle-aged Caucasian male somewhat anxious but in no distress HEENT: Normocephalic pupils equal , dentition adequate Neck: Supple without JVD, adenopathy, or bruit Chest: Clear to auscultation, symmetrical breath sounds, no rhonchi,        Examination lower sternum reveals a prominent xiphoid process with some movement and tenderness, a small epigastric hernia, and evidence of a broken lowest sternal wire.       Cardiovascular: Regular rate and rhythm, no murmur, no gallop, peripheral pulses             palpable in all extremities Abdomen:  Soft, nontender, no palpable mass or organomegaly Extremities: Warm, well-perfused, no clubbing cyanosis, mild bilateral pedal edema but without tenderness,              no venous stasis changes of the legs Rectal/GU: Deferred Neuro: Grossly non--focal and symmetrical throughout Skin: Clean and dry without rash or ulceration   Diagnostic Tests: CT scan personally reviewed and discussed with patient in detail  Impression: Symptomatic deformity lower xiphoid process so she with a broken sternal wire and a small epigastric hernia  Plan:patient  Will be scheduled for surgery later is falling at to be arranged with his schedule and transportation issues.I would expect he will need admission overnight for observation.   Len Childs, MD [facility] [phone]

## 2014-12-20 ENCOUNTER — Other Ambulatory Visit: Payer: Self-pay | Admitting: Cardiovascular Disease

## 2014-12-20 DIAGNOSIS — I2510 Atherosclerotic heart disease of native coronary artery without angina pectoris: Secondary | ICD-10-CM | POA: Diagnosis not present

## 2014-12-21 LAB — HEPATIC FUNCTION PANEL
ALK PHOS: 95 IU/L (ref 39–117)
ALT: 13 IU/L (ref 0–44)
AST: 12 IU/L (ref 0–40)
Albumin: 4.3 g/dL (ref 3.5–4.8)
BILIRUBIN TOTAL: 0.5 mg/dL (ref 0.0–1.2)
Bilirubin, Direct: 0.14 mg/dL (ref 0.00–0.40)
Total Protein: 6.7 g/dL (ref 6.0–8.5)

## 2014-12-27 ENCOUNTER — Emergency Department (HOSPITAL_COMMUNITY)
Admission: EM | Admit: 2014-12-27 | Discharge: 2014-12-27 | Disposition: A | Discharge status: Home / Self Care | Payer: Medicare Other | Source: Home / Self Care | Attending: Emergency Medicine | Admitting: Emergency Medicine

## 2014-12-27 ENCOUNTER — Encounter (HOSPITAL_COMMUNITY): Payer: Self-pay

## 2014-12-27 DIAGNOSIS — S20211A Contusion of right front wall of thorax, initial encounter: Secondary | ICD-10-CM | POA: Diagnosis not present

## 2014-12-27 DIAGNOSIS — R0781 Pleurodynia: Secondary | ICD-10-CM | POA: Diagnosis not present

## 2014-12-27 DIAGNOSIS — D72829 Elevated white blood cell count, unspecified: Secondary | ICD-10-CM | POA: Diagnosis not present

## 2014-12-27 DIAGNOSIS — R4182 Altered mental status, unspecified: Secondary | ICD-10-CM | POA: Diagnosis not present

## 2014-12-27 DIAGNOSIS — G92 Toxic encephalopathy: Secondary | ICD-10-CM | POA: Diagnosis not present

## 2014-12-27 DIAGNOSIS — F419 Anxiety disorder, unspecified: Secondary | ICD-10-CM

## 2014-12-27 DIAGNOSIS — R55 Syncope and collapse: Secondary | ICD-10-CM | POA: Diagnosis not present

## 2014-12-27 LAB — CBC WITH DIFFERENTIAL/PLATELET
BASOS PCT: 0 %
Basophils Absolute: 0 10*3/uL (ref 0.0–0.1)
Eosinophils Absolute: 0.1 10*3/uL (ref 0.0–0.7)
Eosinophils Relative: 1 %
HCT: 44.5 % (ref 39.0–52.0)
HEMOGLOBIN: 15.3 g/dL (ref 13.0–17.0)
LYMPHS ABS: 2 10*3/uL (ref 0.7–4.0)
LYMPHS PCT: 14 %
MCH: 30.9 pg (ref 26.0–34.0)
MCHC: 34.4 g/dL (ref 30.0–36.0)
MCV: 89.9 fL (ref 78.0–100.0)
Monocytes Absolute: 0.5 10*3/uL (ref 0.1–1.0)
Monocytes Relative: 3 %
NEUTROS ABS: 11.6 10*3/uL — AB (ref 1.7–7.7)
NEUTROS PCT: 82 %
Platelets: 258 10*3/uL (ref 150–400)
RBC: 4.95 MIL/uL (ref 4.22–5.81)
RDW: 13.9 % (ref 11.5–15.5)
WBC: 14.1 10*3/uL — ABNORMAL HIGH (ref 4.0–10.5)

## 2014-12-27 LAB — COMPREHENSIVE METABOLIC PANEL
ALBUMIN: 4.2 g/dL (ref 3.5–5.0)
ALK PHOS: 92 U/L (ref 38–126)
ALT: 18 U/L (ref 17–63)
ANION GAP: 7 (ref 5–15)
AST: 17 U/L (ref 15–41)
BUN: 28 mg/dL — ABNORMAL HIGH (ref 6–20)
CALCIUM: 9.8 mg/dL (ref 8.9–10.3)
CO2: 24 mmol/L (ref 22–32)
Chloride: 101 mmol/L (ref 101–111)
Creatinine, Ser: 0.8 mg/dL (ref 0.61–1.24)
GLUCOSE: 111 mg/dL — AB (ref 65–99)
POTASSIUM: 4.5 mmol/L (ref 3.5–5.1)
Sodium: 132 mmol/L — ABNORMAL LOW (ref 135–145)
TOTAL PROTEIN: 7.3 g/dL (ref 6.5–8.1)
Total Bilirubin: 0.6 mg/dL (ref 0.3–1.2)

## 2014-12-27 MED ORDER — PANTOPRAZOLE SODIUM 40 MG IV SOLR
40.0000 mg | Freq: Once | INTRAVENOUS | Status: AC
  Administered 2014-12-27: 40 mg via INTRAVENOUS
  Filled 2014-12-27: qty 40

## 2014-12-27 MED ORDER — LORAZEPAM 1 MG PO TABS: ORAL_TABLET | ORAL | Status: DC

## 2014-12-27 MED ORDER — ONDANSETRON HCL 4 MG/2ML IJ SOLN
4.0000 mg | Freq: Once | INTRAMUSCULAR | Status: AC
  Administered 2014-12-27: 4 mg via INTRAVENOUS
  Filled 2014-12-27: qty 2

## 2014-12-27 MED ORDER — SODIUM CHLORIDE 0.9 % IV BOLUS (SEPSIS)
1000.0000 mL | Freq: Once | INTRAVENOUS | Status: AC
  Administered 2014-12-27: 1000 mL via INTRAVENOUS

## 2014-12-27 MED ORDER — LORAZEPAM 2 MG/ML IJ SOLN
0.5000 mg | Freq: Once | INTRAMUSCULAR | Status: AC
  Administered 2014-12-27: 0.5 mg via INTRAVENOUS
  Filled 2014-12-27: qty 1

## 2014-12-27 NOTE — Discharge Instructions (Signed)
Follow up with your md next week. °

## 2014-12-27 NOTE — ED Notes (Signed)
Pt reports has been on oxycodone and lorazepam.  Reports ran out 2 weeks ago and ran out of lorazepam 5 days ago.  Pt c/o nausea, says thinks is going through withdrawals.  Pt also says had wires put in after his open heart surgery and coughed a couple of months ago and one of the wires came loose.  Pt says is supposed to have surgery to repair it soon.

## 2014-12-27 NOTE — ED Provider Notes (Signed)
CSN: [id number]     Arrival date & time 12/27/14  6834 History   This chart was scribed for Milton Ferguson, MD by Terressa Koyanagi, ED Scribe. This patient was seen in room [facility] and the patient's care was started at 9:36 AM.   Chief Complaint  Patient presents with  . nausea    Patient is a 74 y.o. male presenting with general illness. The history is provided by the patient. No language interpreter was used.  Illness Location:  Loss of appetitie  Severity:  Moderate Timing:  Constant Progression:  Worsening Chronicity:  New Associated symptoms: no abdominal pain, no chest pain, no congestion, no cough, no diarrhea, no fatigue, no headaches and no rash    PCP: Glo Herring., MD HPI Comments: Rodney Decker is a 74 y.o. male, with PMHx noted below, who presents to the Emergency Department complaining of insomnia and loss of appetite onset after pt rant out of his Lorazepam 5 days ago. Pt reports he did not eat anything yesterday and the days prior had to force himself to eat.  Pt notes he was Rx 2mg  of Lorazepam daily, however, he was taking 8mg /day and ran out of his Rx early; pt notes "I was taking too much." Pt states his Lorazepam refill is due in 2 days; each refill consists of 100 pills. Pt reports he ran out of his oxycodone 2 weeks ago. Pt denies SI or vomiting. Pt reports he drove himself to the ED.    Past Medical History  Diagnosis Date  . Hypertension   . CAD (coronary artery disease)   . Insomnia   . Depression   . Colon polyps   . GERD (gastroesophageal reflux disease)   . Dyslipidemia   . Myocardial infarction 2011  . Arthritis   . Ruptured lumbar disc    Past Surgical History  Procedure Laterality Date  . Colonoscopy 10/10/2001 for surveillqance for colonic polyps.      Biopsy revealed hyperplastic polyps. Next colonoscopy in 3 yrs.  Marland Kitchen Appendectomy    . Colonoscopy  03/24/2011    Procedure: COLONOSCOPY;  Surgeon: Rogene Houston, MD;  [facility];  Service: Endoscopy;  Laterality: N/A;  9:30  . Cardiac surgery    . Cardiac catheterization  04/17/2009    referred for CABG  . Coronary artery bypass graft  2011    x5   . Hernia repair      left inguinal- Dr Romona Curls  . Cataract extraction w/phaco Right 08/13/2013    Procedure: CATARACT EXTRACTION PHACO AND INTRAOCULAR LENS PLACEMENT (IOC);  Surgeon: Tonny Branch, MD;  Location: [facility];  Service: Ophthalmology;  Laterality: Right;  CDE 7.11  . Cataract extraction w/phaco Left 09/06/2013    Procedure: CATARACT EXTRACTION PHACO AND INTRAOCULAR LENS PLACEMENT (IOC);  Surgeon: Tonny Branch, MD;  Location: [facility];  Service: Ophthalmology;  Laterality: Left;  CDE: 9.87  . Colonoscopy N/A 12/20/2013    Procedure: COLONOSCOPY;  Surgeon: Rogene Houston, MD;  [facility]: [facility];  Service: Endoscopy;  Laterality: N/A;  930   Family History  Problem Relation Age of Onset  . Colon cancer Neg Hx    Social History  Substance Use Topics  . Smoking status: Former Smoker -- 0.50 packs/day for 40 years    Types: Cigarettes    Start date: 05/29/1961    Quit date: 03/14/2005  . Smokeless tobacco: Never Used     Comment: Patient states that he smoked 10 cigarettes a  day  . Alcohol Use: No    Review of Systems  Constitutional: Positive for appetite change. Negative for fatigue.  HENT: Negative for congestion, ear discharge and sinus pressure.   Eyes: Negative for discharge.  Respiratory: Negative for cough.   Cardiovascular: Negative for chest pain.  Gastrointestinal: Negative for abdominal pain and diarrhea.  Genitourinary: Negative for frequency and hematuria.  Musculoskeletal: Negative for back pain.  Skin: Negative for rash.  Neurological: Negative for seizures and headaches.  Psychiatric/Behavioral: Positive for sleep disturbance. Negative for suicidal ideas and hallucinations.      Allergies  Claritin and Cymbalta  Home Medications   Prior to Admission medications    Medication Sig Start Date End Date Taking? Authorizing Provider  aspirin EC 81 MG tablet Take 81 mg by mouth daily.   Yes Historical Provider, MD  atorvastatin (LIPITOR) 40 MG tablet Take 1 tablet (40 mg total) by mouth daily. 10/29/13  Yes Herminio Commons, MD  lisinopril (PRINIVIL,ZESTRIL) 5 MG tablet Take 5 mg by mouth daily.    Yes Historical Provider, MD  LORazepam (ATIVAN) 2 MG tablet Take 2 mg by mouth daily as needed. For anxiety/sleep   Yes Historical Provider, MD  buPROPion (WELLBUTRIN) 100 MG tablet Take 100 mg by mouth daily.     Historical Provider, MD  nitroGLYCERIN (NITROSTAT) 0.4 MG SL tablet Place 1 tablet (0.4 mg total) under the tongue every 5 (five) minutes as needed for chest pain. 05/29/14   Herminio Commons, MD  promethazine (PHENERGAN) 25 MG tablet Take 25 mg by mouth every 6 (six) hours as needed for nausea or vomiting.    Historical Provider, MD  traMADol (ULTRAM) 50 MG tablet Take by mouth every 6 (six) hours as needed.    Historical Provider, MD   Triage Vitals: BP 153/87 mmHg  Pulse 83  Temp(Src) 98.3 F (36.8 C) (Oral)  Resp 20  Ht 5\' 11"  (1.803 m)  Wt 190 lb (86.183 kg)  BMI 26.51 kg/m2  SpO2 95% Physical Exam  Constitutional: He is oriented to person, place, and time. He appears well-developed.  HENT:  Head: Normocephalic.  Eyes: Conjunctivae and EOM are normal. No scleral icterus.  Neck: Neck supple. No thyromegaly present.  Cardiovascular: Normal rate and regular rhythm.  Exam reveals no gallop and no friction rub.   No murmur heard. Pulmonary/Chest: No stridor. He has no wheezes. He has no rales. He exhibits no tenderness.  Abdominal: He exhibits no distension. There is no tenderness. There is no rebound.  Musculoskeletal: Normal range of motion. He exhibits no edema.  Lymphadenopathy:    He has no cervical adenopathy.  Neurological: He is oriented to person, place, and time. He exhibits normal muscle tone. Coordination normal.  Skin: No rash  noted. No erythema.  Psychiatric: He has a normal mood and affect. His behavior is normal.    ED Course  Procedures (including critical care time) DIAGNOSTIC STUDIES: Oxygen Saturation is 95% on RA, adequate by my interpretation.    COORDINATION OF CARE: 9:41 AM: Discussed treatment plan with pt at bedside; patient verbalizes understanding and agrees with treatment plan.  Labs Review Labs Reviewed - No data to display  I have personally reviewed and evaluated these lab results as part of my medical decision-making.   EKG Interpretation None      MDM   Final diagnoses:  None   patient having anxiety and withdrawal symptoms running out of his oxycodone and lorazepam. Labs unremarkable except for mild dehydration patient  was given 1 L normal saline. Patient will be given prescription for Ativan 1 mg #15 pills and he is to follow-up with his primary care doctor.j    Milton Ferguson, MD 12/27/14 1413

## 2014-12-29 ENCOUNTER — Encounter (HOSPITAL_COMMUNITY): Payer: Self-pay

## 2014-12-29 ENCOUNTER — Inpatient Hospital Stay (HOSPITAL_COMMUNITY): Payer: Medicare Other

## 2014-12-29 ENCOUNTER — Emergency Department (HOSPITAL_COMMUNITY): Payer: Medicare Other

## 2014-12-29 ENCOUNTER — Inpatient Hospital Stay (HOSPITAL_COMMUNITY)
Admission: EM | Admit: 2014-12-29 | Discharge: 2014-12-31 | DRG: 092 | Disposition: A | Discharge status: Home / Self Care | Payer: Medicare Other | Attending: Internal Medicine | Admitting: Internal Medicine

## 2014-12-29 DIAGNOSIS — Z87891 Personal history of nicotine dependence: Secondary | ICD-10-CM

## 2014-12-29 DIAGNOSIS — R4182 Altered mental status, unspecified: Secondary | ICD-10-CM

## 2014-12-29 DIAGNOSIS — G9340 Encephalopathy, unspecified: Secondary | ICD-10-CM | POA: Diagnosis not present

## 2014-12-29 DIAGNOSIS — I2510 Atherosclerotic heart disease of native coronary artery without angina pectoris: Secondary | ICD-10-CM | POA: Diagnosis present

## 2014-12-29 DIAGNOSIS — R404 Transient alteration of awareness: Secondary | ICD-10-CM | POA: Diagnosis not present

## 2014-12-29 DIAGNOSIS — F419 Anxiety disorder, unspecified: Secondary | ICD-10-CM | POA: Diagnosis present

## 2014-12-29 DIAGNOSIS — E785 Hyperlipidemia, unspecified: Secondary | ICD-10-CM | POA: Diagnosis present

## 2014-12-29 DIAGNOSIS — Z7982 Long term (current) use of aspirin: Secondary | ICD-10-CM

## 2014-12-29 DIAGNOSIS — D72829 Elevated white blood cell count, unspecified: Secondary | ICD-10-CM

## 2014-12-29 DIAGNOSIS — R0781 Pleurodynia: Secondary | ICD-10-CM | POA: Diagnosis not present

## 2014-12-29 DIAGNOSIS — S20211A Contusion of right front wall of thorax, initial encounter: Secondary | ICD-10-CM

## 2014-12-29 DIAGNOSIS — F13239 Sedative, hypnotic or anxiolytic dependence with withdrawal, unspecified: Secondary | ICD-10-CM | POA: Diagnosis present

## 2014-12-29 DIAGNOSIS — W19XXXA Unspecified fall, initial encounter: Secondary | ICD-10-CM | POA: Diagnosis present

## 2014-12-29 DIAGNOSIS — I252 Old myocardial infarction: Secondary | ICD-10-CM | POA: Diagnosis not present

## 2014-12-29 DIAGNOSIS — K219 Gastro-esophageal reflux disease without esophagitis: Secondary | ICD-10-CM | POA: Diagnosis present

## 2014-12-29 DIAGNOSIS — N390 Urinary tract infection, site not specified: Secondary | ICD-10-CM | POA: Insufficient documentation

## 2014-12-29 DIAGNOSIS — W19XXXD Unspecified fall, subsequent encounter: Secondary | ICD-10-CM | POA: Diagnosis not present

## 2014-12-29 DIAGNOSIS — R42 Dizziness and giddiness: Secondary | ICD-10-CM | POA: Diagnosis not present

## 2014-12-29 DIAGNOSIS — R55 Syncope and collapse: Secondary | ICD-10-CM | POA: Diagnosis not present

## 2014-12-29 DIAGNOSIS — Z951 Presence of aortocoronary bypass graft: Secondary | ICD-10-CM

## 2014-12-29 DIAGNOSIS — E860 Dehydration: Secondary | ICD-10-CM | POA: Diagnosis present

## 2014-12-29 DIAGNOSIS — F13231 Sedative, hypnotic or anxiolytic dependence with withdrawal delirium: Secondary | ICD-10-CM | POA: Diagnosis not present

## 2014-12-29 DIAGNOSIS — G4700 Insomnia, unspecified: Secondary | ICD-10-CM | POA: Diagnosis present

## 2014-12-29 DIAGNOSIS — G92 Toxic encephalopathy: Secondary | ICD-10-CM | POA: Diagnosis present

## 2014-12-29 DIAGNOSIS — J449 Chronic obstructive pulmonary disease, unspecified: Secondary | ICD-10-CM | POA: Diagnosis not present

## 2014-12-29 DIAGNOSIS — F13939 Sedative, hypnotic or anxiolytic use, unspecified with withdrawal, unspecified: Secondary | ICD-10-CM | POA: Diagnosis present

## 2014-12-29 DIAGNOSIS — M1990 Unspecified osteoarthritis, unspecified site: Secondary | ICD-10-CM | POA: Diagnosis present

## 2014-12-29 DIAGNOSIS — I10 Essential (primary) hypertension: Secondary | ICD-10-CM | POA: Diagnosis present

## 2014-12-29 LAB — COMPREHENSIVE METABOLIC PANEL
ALBUMIN: 4.3 g/dL (ref 3.5–5.0)
ALK PHOS: 90 U/L (ref 38–126)
ALT: 22 U/L (ref 17–63)
AST: 24 U/L (ref 15–41)
Anion gap: 8 (ref 5–15)
BUN: 21 mg/dL — AB (ref 6–20)
CALCIUM: 9.5 mg/dL (ref 8.9–10.3)
CO2: 26 mmol/L (ref 22–32)
CREATININE: 1.03 mg/dL (ref 0.61–1.24)
Chloride: 101 mmol/L (ref 101–111)
GFR calc Af Amer: >60 mL/min (ref >60)
GFR calc non Af Amer: >60 mL/min (ref >60)
GLUCOSE: 109 mg/dL — AB (ref 65–99)
Potassium: 3.7 mmol/L (ref 3.5–5.1)
SODIUM: 135 mmol/L (ref 135–145)
Total Bilirubin: 1 mg/dL (ref 0.3–1.2)
Total Protein: 7.3 g/dL (ref 6.5–8.1)

## 2014-12-29 LAB — CBC WITH DIFFERENTIAL/PLATELET
BASOS ABS: 0 10*3/uL (ref 0.0–0.1)
BASOS PCT: 0 %
Band Neutrophils: 0 %
Blasts: 0 %
EOS PCT: 0 %
Eosinophils Absolute: 0 10*3/uL (ref 0.0–0.7)
HCT: 45.3 % (ref 39.0–52.0)
HEMOGLOBIN: 15.5 g/dL (ref 13.0–17.0)
LYMPHS ABS: 1.1 10*3/uL (ref 0.7–4.0)
Lymphocytes Relative: 5 %
MCH: 30.9 pg (ref 26.0–34.0)
MCHC: 34.2 g/dL (ref 30.0–36.0)
MCV: 90.2 fL (ref 78.0–100.0)
METAMYELOCYTES PCT: 0 %
MONO ABS: 1.1 10*3/uL — AB (ref 0.1–1.0)
MYELOCYTES: 0 %
Monocytes Relative: 5 %
Neutro Abs: 20.6 10*3/uL — ABNORMAL HIGH (ref 1.7–7.7)
Neutrophils Relative %: 90 %
Other: 0 %
Platelets: 281 10*3/uL (ref 150–400)
Promyelocytes Absolute: 0 %
RBC: 5.02 MIL/uL (ref 4.22–5.81)
RDW: 14 % (ref 11.5–15.5)
WBC: 22.8 10*3/uL — AB (ref 4.0–10.5)
nRBC: 0 /100 WBC

## 2014-12-29 LAB — URINALYSIS, ROUTINE W REFLEX MICROSCOPIC
Bilirubin Urine: NEGATIVE
GLUCOSE, UA: NEGATIVE mg/dL
Ketones, ur: 15 mg/dL — AB
Leukocytes, UA: NEGATIVE
Nitrite: NEGATIVE
Protein, ur: NEGATIVE mg/dL
Urobilinogen, UA: 0.2 mg/dL (ref 0.0–1.0)
pH: 5.5 (ref 5.0–8.0)

## 2014-12-29 LAB — TROPONIN I: Troponin I: <0.03 ng/mL (ref <0.031)

## 2014-12-29 LAB — URINE MICROSCOPIC-ADD ON

## 2014-12-29 LAB — CBG MONITORING, ED: Glucose-Capillary: 135 mg/dL — ABNORMAL HIGH (ref 65–99)

## 2014-12-29 MED ORDER — SODIUM CHLORIDE 0.9 % IV BOLUS (SEPSIS)
1000.0000 mL | Freq: Once | INTRAVENOUS | Status: AC
  Administered 2014-12-29: 1000 mL via INTRAVENOUS

## 2014-12-29 MED ORDER — DEXTROSE 5 % IV SOLN
1.0000 g | Freq: Once | INTRAVENOUS | Status: DC
  Filled 2014-12-29: qty 10

## 2014-12-29 NOTE — ED Notes (Signed)
Pt in via EMS, pt had two falls at home today. States first time was around 12:00 says he felt weak before fall and believes he "blacked" out. Denies any symptoms denies pain.

## 2014-12-29 NOTE — ED Provider Notes (Addendum)
CSN: [id number]     Arrival date & time 12/29/14  1427 History   First MD Initiated Contact with Patient 12/29/14 1504     Chief Complaint  Patient presents with  . Fall     (Consider location/radiation/quality/duration/timing/severity/associated sxs/prior Treatment) HPI..... Level V caveat for urgent need for intervention. Patient states he fell 2 today at home striking his right chest wall. He feels weak and said he "blacked out".   No substernal chest pain, dyspnea, cough, dysuria, stiff neck. His speech is rambling. No frank neurological deficits.  Past Medical History  Diagnosis Date  . Hypertension   . CAD (coronary artery disease)   . Insomnia   . Depression   . Colon polyps   . GERD (gastroesophageal reflux disease)   . Dyslipidemia   . Myocardial infarction 2011  . Arthritis   . Ruptured lumbar disc    Past Surgical History  Procedure Laterality Date  . Colonoscopy 10/10/2001 for surveillqance for colonic polyps.      Biopsy revealed hyperplastic polyps. Next colonoscopy in 3 yrs.  Marland Kitchen Appendectomy    . Colonoscopy  03/24/2011    Procedure: COLONOSCOPY;  Surgeon: Rogene Houston, MD;  Location: [facility];  Service: Endoscopy;  Laterality: N/A;  9:30  . Cardiac surgery    . Cardiac catheterization  04/17/2009    referred for CABG  . Coronary artery bypass graft  2011    x5   . Hernia repair      left inguinal- Dr Romona Curls  . Cataract extraction w/phaco Right 08/13/2013    Procedure: CATARACT EXTRACTION PHACO AND INTRAOCULAR LENS PLACEMENT (IOC);  Surgeon: Tonny Branch, MD;  Location: [facility];  Service: Ophthalmology;  Laterality: Right;  CDE 7.11  . Cataract extraction w/phaco Left 09/06/2013    Procedure: CATARACT EXTRACTION PHACO AND INTRAOCULAR LENS PLACEMENT (IOC);  Surgeon: Tonny Branch, MD;  Location: [facility];  Service: Ophthalmology;  Laterality: Left;  CDE: 9.87  . Colonoscopy N/A 12/20/2013    Procedure: COLONOSCOPY;  Surgeon: Rogene Houston, MD;  Location: [facility];  Service: Endoscopy;  Laterality: N/A;  930   Family History  Problem Relation Age of Onset  . Colon cancer Neg Hx    Social History  Substance Use Topics  . Smoking status: Former Smoker -- 0.50 packs/day for 40 years    Types: Cigarettes    Start date: 05/29/1961    Quit date: 03/14/2005  . Smokeless tobacco: Never Used     Comment: Patient states that he smoked 10 cigarettes a day  . Alcohol Use: No    Review of Systems  Unable to perform ROS: Other      Allergies  Claritin and Cymbalta  Home Medications   Prior to Admission medications   Medication Sig Start Date End Date Taking? Authorizing Provider  atorvastatin (LIPITOR) 40 MG tablet Take 1 tablet (40 mg total) by mouth daily. 10/29/13  Yes Herminio Commons, MD  aspirin EC 81 MG tablet Take 81 mg by mouth daily.    Historical Provider, MD  buPROPion (WELLBUTRIN) 100 MG tablet Take 100 mg by mouth daily.     Historical Provider, MD  lisinopril (PRINIVIL,ZESTRIL) 5 MG tablet Take 5 mg by mouth daily.     Historical Provider, MD  LORazepam (ATIVAN) 1 MG tablet Take one pill twice a day for anxiety or for sleep 12/27/14   Milton Ferguson, MD  nitroGLYCERIN (NITROSTAT) 0.4 MG SL tablet Place 1 tablet (0.4 mg total)  under the tongue every 5 (five) minutes as needed for chest pain. 05/29/14   Herminio Commons, MD  promethazine (PHENERGAN) 25 MG tablet Take 25 mg by mouth every 6 (six) hours as needed for nausea or vomiting.    Historical Provider, MD  traMADol (ULTRAM) 50 MG tablet Take by mouth every 6 (six) hours as needed.    Historical Provider, MD   BP 160/92 mmHg  Pulse 71  Temp(Src) 98.3 F (36.8 C) (Oral)  Resp 24  Ht 5\' 11"  (1.803 m)  Wt 170 lb (77.111 kg)  BMI 23.72 kg/m2  SpO2 98% Physical Exam  Constitutional:  Dehydrated, rambling speech.  HENT:  Head: Normocephalic and atraumatic.  Eyes: Conjunctivae and EOM are normal. Pupils are equal, round, and reactive to light.  Neck: Normal range  of motion. Neck supple.  Cardiovascular: Normal rate and regular rhythm.   Pulmonary/Chest: Effort normal and breath sounds normal.  Abdominal: Soft. Bowel sounds are normal.  Musculoskeletal: Normal range of motion.  Neurological: He is alert.  Skin: Skin is warm and dry.  Psychiatric:  Flat affect  Nursing note and vitals reviewed.   ED Course  Procedures (including critical care time) Labs Review Labs Reviewed  CBC WITH DIFFERENTIAL/PLATELET - Abnormal; Notable for the following:    WBC 22.8 (*)    Neutro Abs 20.6 (*)    Monocytes Absolute 1.1 (*)    All other components within normal limits  COMPREHENSIVE METABOLIC PANEL - Abnormal; Notable for the following:    Glucose, Bld 109 (*)    BUN 21 (*)    All other components within normal limits  URINALYSIS, ROUTINE W REFLEX MICROSCOPIC (NOT AT [facility]) - Abnormal; Notable for the following:    Specific Gravity, Urine >1.030 (*)    Hgb urine dipstick LARGE (*)    Ketones, ur 15 (*)    All other components within normal limits  URINE MICROSCOPIC-ADD ON - Abnormal; Notable for the following:    Bacteria, UA FEW (*)    All other components within normal limits  CBG MONITORING, ED - Abnormal; Notable for the following:    Glucose-Capillary 135 (*)    All other components within normal limits  CULTURE, BLOOD (ROUTINE X 2)  CULTURE, BLOOD (ROUTINE X 2)  TROPONIN I    Imaging Review Dg Ribs Unilateral W/chest Right  12/29/2014   CLINICAL DATA:  Recurrent falls including tendon today with mid to lower anterior rib pain. Initial encounter.  EXAM: RIGHT RIBS AND CHEST - 3+ VIEW  COMPARISON:  CT chest 12/11/2014.  PA and lateral chest 06/19/2014.  FINDINGS: There is no pneumothorax. Heart size is upper normal. The patient is status post CABG. Mild left basilar atelectasis is noted. No rib fracture is identified.  IMPRESSION: Negative for fracture.  No acute cardiopulmonary disease.   Electronically Signed   By: Inge Rise M.D.   On:  12/29/2014 16:53   I have personally reviewed and evaluated these images and lab results as part of my medical decision-making.   EKG Interpretation   Date/Time:  Sunday December 29 2014 14:27:45 EDT Ventricular Rate:  87 PR Interval:  146 QRS Duration: 91 QT Interval:  352 QTC Calculation: 423 R Axis:   81 Text Interpretation:  Sinus rhythm Borderline right axis deviation No  significant change since last tracing Confirmed by Christy Gentles  MD, [place]  [id number]) on 12/29/2014 2:31:54 PM      MDM   Final diagnoses:  Syncope, unspecified syncope type  Chest wall contusion, right, initial encounter  Leukocytosis    Patient allegedly had a syncopal spell. White count 20 2.8K. Urinalysis reveals the possibility of an infection. IV Rocephin. EKG normal. Urine culture, PA and lateral chest, CT head pending. Admit to general medicine.    Nat Christen, MD 12/29/14 [id number]  Nat Christen, MD 12/29/14 [place], MD 12/29/14 [phone]

## 2014-12-29 NOTE — ED Notes (Signed)
Report given to Denise RN.

## 2014-12-29 NOTE — ED Notes (Signed)
Speech clear, however patient rambles and speaks off topic even when asked direct yes/no questions.

## 2014-12-29 NOTE — ED Notes (Signed)
Patient unable to give a urine sample. In and out cath attempted and unsuccessful. RN made aware.

## 2014-12-29 NOTE — H&P (Signed)
PCP:   Glo Herring., MD   Chief Complaint:  Fall, altered mental status  HPI:  74 year old male who  has a past medical history of Hypertension; CAD (coronary artery disease); Insomnia; Depression; Colon polyps; GERD (gastroesophageal reflux disease); Dyslipidemia; Myocardial infarction (2011); Arthritis; and Ruptured lumbar disc. Today presents to the hospital with complaints of fall and striking his right chest wall. By the time I saw the patient, though he was alert and oriented 3, but clearly confused and talking about "drug store not giving him oxycodone, his neighbors selling drugs, ED doctor told him that he would perform surgery on the hip". Patient unable to provide any significant history, he was found to have abnormal UA with 21-50 RBCs per high-power field, leukocytosis white count 22,000. Rib series negative for rib fracture CT head and chest x-ray ordered. Patient empirically started on Rocephin for possible UTI   Allergies:   Allergies  Allergen Reactions  . Claritin [Loratadine] Other (See Comments)    unknown  . Cymbalta [Duloxetine Hcl] Rash      Past Medical History  Diagnosis Date  . Hypertension   . CAD (coronary artery disease)   . Insomnia   . Depression   . Colon polyps   . GERD (gastroesophageal reflux disease)   . Dyslipidemia   . Myocardial infarction 2011  . Arthritis   . Ruptured lumbar disc     Past Surgical History  Procedure Laterality Date  . Colonoscopy 10/10/2001 for surveillqance for colonic polyps.      Biopsy revealed hyperplastic polyps. Next colonoscopy in 3 yrs.  Marland Kitchen Appendectomy    . Colonoscopy  03/24/2011    Procedure: COLONOSCOPY;  Surgeon: Rogene Houston, MD;  [facility]: [facility];  Service: Endoscopy;  Laterality: N/A;  9:30  . Cardiac surgery    . Cardiac catheterization  04/17/2009    referred for CABG  . Coronary artery bypass graft  2011    x5   . Hernia repair      left inguinal- Dr Romona Curls  . Cataract  extraction w/phaco Right 08/13/2013    Procedure: CATARACT EXTRACTION PHACO AND INTRAOCULAR LENS PLACEMENT (IOC);  Surgeon: Tonny Branch, MD;  Location: [facility];  Service: Ophthalmology;  Laterality: Right;  CDE 7.11  . Cataract extraction w/phaco Left 09/06/2013    Procedure: CATARACT EXTRACTION PHACO AND INTRAOCULAR LENS PLACEMENT (IOC);  Surgeon: Tonny Branch, MD;  Location: [facility];  Service: Ophthalmology;  Laterality: Left;  CDE: 9.87  . Colonoscopy N/A 12/20/2013    Procedure: COLONOSCOPY;  Surgeon: Rogene Houston, MD;  Location: [facility];  Service: Endoscopy;  Laterality: N/A;  930    Prior to Admission medications   Medication Sig Start Date End Date Taking? Authorizing Provider  atorvastatin (LIPITOR) 40 MG tablet Take 1 tablet (40 mg total) by mouth daily. 10/29/13  Yes Herminio Commons, MD  aspirin EC 81 MG tablet Take 81 mg by mouth daily.    Historical Provider, MD  buPROPion (WELLBUTRIN) 100 MG tablet Take 100 mg by mouth daily.     Historical Provider, MD  lisinopril (PRINIVIL,ZESTRIL) 5 MG tablet Take 5 mg by mouth daily.     Historical Provider, MD  LORazepam (ATIVAN) 1 MG tablet Take one pill twice a day for anxiety or for sleep 12/27/14   Milton Ferguson, MD  nitroGLYCERIN (NITROSTAT) 0.4 MG SL tablet Place 1 tablet (0.4 mg total) under the tongue every 5 (five) minutes as needed for chest pain. 05/29/14  Herminio Commons, MD  promethazine (PHENERGAN) 25 MG tablet Take 25 mg by mouth every 6 (six) hours as needed for nausea or vomiting.    Historical Provider, MD  traMADol (ULTRAM) 50 MG tablet Take by mouth every 6 (six) hours as needed.    Historical Provider, MD    Social History:  reports that he quit smoking about 9 years ago. His smoking use included Cigarettes. He started smoking about 53 years ago. He has a 20 pack-year smoking history. He has never used smokeless tobacco. He reports that he does not drink alcohol or use illicit drugs.  Family History  Problem  Relation Age of Onset  . Colon cancer Neg Hx     Filed Weights   12/29/14 1429  Weight: 77.111 kg (170 lb)     Review of Systems:  Unobtainable due to patient's altered mental status   Physical Exam: Blood pressure 144/74, pulse 71, temperature 98.3 F (36.8 C), temperature source Oral, resp. rate 24, height 5\' 11"  (1.803 m), weight 77.111 kg (170 lb), SpO2 98 %. Constitutional:   Patient is a well-developed and well-nourished male in no acute distress and cooperative with exam, pleasantly confused. Head: Normocephalic and atraumatic Mouth: Mucus membranes moist Eyes: PERRL, EOMI, conjunctivae normal Neck: Supple, No Thyromegaly Cardiovascular: RRR, S1 normal, S2 normal Pulmonary/Chest: CTAB, no wheezes, rales, or rhonchi Abdominal: Soft. Non-tender, non-distended, bowel sounds are normal, no masses, organomegaly, or guarding present.  Neurological: A&O x3, Strength is normal and symmetric bilaterally, cranial nerve II-XII are grossly intact, no focal motor deficit, sensory intact to light touch bilaterally.  Extremities : No Cyanosis, Clubbing or Edema  Labs on Admission:  Basic Metabolic Panel:  Recent Labs Lab 12/27/14 1021 12/29/14 1554  NA 132* 135  K 4.5 3.7  CL 101 101  CO2 24 26  GLUCOSE 111* 109*  BUN 28* 21*  CREATININE 0.80 1.03  CALCIUM 9.8 9.5   Liver Function Tests:  Recent Labs Lab 12/27/14 1021 12/29/14 1554  AST 17 24  ALT 18 22  ALKPHOS 92 90  BILITOT 0.6 1.0  PROT 7.3 7.3  ALBUMIN 4.2 4.3    CBC:  Recent Labs Lab 12/27/14 1021 12/29/14 1554  WBC 14.1* 22.8*  NEUTROABS 11.6* 20.6*  HGB 15.3 15.5  HCT 44.5 45.3  MCV 89.9 90.2  PLT 258 281   Cardiac Enzymes:  Recent Labs Lab 12/29/14 1554  TROPONINI <0.03    CBG:  Recent Labs Lab 12/29/14 1437  GLUCAP 135*    Radiological Exams on Admission: Dg Ribs Unilateral W/chest Right  12/29/2014   CLINICAL DATA:  Recurrent falls including tendon today with mid to lower  anterior rib pain. Initial encounter.  EXAM: RIGHT RIBS AND CHEST - 3+ VIEW  COMPARISON:  CT chest 12/11/2014.  PA and lateral chest 06/19/2014.  FINDINGS: There is no pneumothorax. Heart size is upper normal. The patient is status post CABG. Mild left basilar atelectasis is noted. No rib fracture is identified.  IMPRESSION: Negative for fracture.  No acute cardiopulmonary disease.   Electronically Signed   By: Inge Rise M.D.   On: 12/29/2014 16:53    EKG: Independently reviewed. Normal sinus rhythm   Assessment/Plan Active Problems:   Syncope   Altered mental status   UTI (lower urinary tract infection)  Altered mental status/encephalopathy Patient presenting with an encephalopathy, likely from UTI versus benzodiazepine withdrawal. Patient was seen in the ED 2 days ago at that time he complained of insomnia as he  was out of his Ativan for past 5 days. Patient was taking 8 mg Ativan daily. He was prescribed 15 pills on 12/27/2014. CT head has been ordered, will obtain CT head to rule out any intracranial bleed or stroke.  Leukocytosis Likely from UTI, though patient does not have leukocytes and nitrate is negative on the UA. But does have hematuria, and with altered mental status and leukocytosis will empirically treat him with Rocephin. Urine culture has been obtained. Chest x-ray is pending  ? Benzodiazepine withdrawal As above patient has possible benzodiazepine withdrawal, will start Ativan 1 mg every 4 hours when necessary for anxiety.  Fall/? Syncope CT head has been ordered, will monitor the patient on telemetry Will obtain serial cardiac enzymes  DVT prophylaxis SCDs, will await the CT results before starting Lovenox  Code status: Full code, presumed  Family discussion: No family at bedside   Time Spent on Admission: 60 min  [facility] Pager: [phone] 12/29/2014, 11:13 PM  If 7PM-7AM, please contact night-coverage  [email or web address]    Password [email or web address]

## 2014-12-30 DIAGNOSIS — G9340 Encephalopathy, unspecified: Secondary | ICD-10-CM

## 2014-12-30 DIAGNOSIS — F13231 Sedative, hypnotic or anxiolytic dependence with withdrawal delirium: Secondary | ICD-10-CM

## 2014-12-30 LAB — CBC
HEMATOCRIT: 40.7 % (ref 39.0–52.0)
Hemoglobin: 13.6 g/dL (ref 13.0–17.0)
MCH: 30.5 pg (ref 26.0–34.0)
MCHC: 33.4 g/dL (ref 30.0–36.0)
MCV: 91.3 fL (ref 78.0–100.0)
PLATELETS: 243 10*3/uL (ref 150–400)
RBC: 4.46 MIL/uL (ref 4.22–5.81)
RDW: 14.2 % (ref 11.5–15.5)
WBC: 11.6 10*3/uL — AB (ref 4.0–10.5)

## 2014-12-30 LAB — COMPREHENSIVE METABOLIC PANEL
ALK PHOS: 76 U/L (ref 38–126)
ALT: 21 U/L (ref 17–63)
AST: 30 U/L (ref 15–41)
Albumin: 3.6 g/dL (ref 3.5–5.0)
Anion gap: 5 (ref 5–15)
BILIRUBIN TOTAL: 1.3 mg/dL — AB (ref 0.3–1.2)
BUN: 14 mg/dL (ref 6–20)
CALCIUM: 9 mg/dL (ref 8.9–10.3)
CHLORIDE: 107 mmol/L (ref 101–111)
CO2: 27 mmol/L (ref 22–32)
CREATININE: 0.98 mg/dL (ref 0.61–1.24)
Glucose, Bld: 98 mg/dL (ref 65–99)
Potassium: 4 mmol/L (ref 3.5–5.1)
Sodium: 139 mmol/L (ref 135–145)
TOTAL PROTEIN: 6.2 g/dL — AB (ref 6.5–8.1)

## 2014-12-30 MED ORDER — ENOXAPARIN SODIUM 40 MG/0.4ML ~~LOC~~ SOLN: 40.0000 mg | SUBCUTANEOUS | Status: DC

## 2014-12-30 MED ORDER — SODIUM CHLORIDE 0.9 % IV SOLN: INTRAVENOUS | Status: AC

## 2014-12-30 MED ORDER — LORAZEPAM 2 MG/ML IJ SOLN
1.0000 mg | INTRAMUSCULAR | Status: DC | PRN
  Administered 2014-12-30: 1 mg via INTRAVENOUS
  Filled 2014-12-30: qty 1

## 2014-12-30 MED ORDER — LISINOPRIL 5 MG PO TABS
5.0000 mg | ORAL_TABLET | Freq: Every day | ORAL | Status: DC
  Administered 2014-12-30 – 2014-12-31 (×2): 5 mg via ORAL
  Filled 2014-12-30 (×2): qty 1

## 2014-12-30 MED ORDER — BUPROPION HCL 100 MG PO TABS
100.0000 mg | ORAL_TABLET | Freq: Every day | ORAL | Status: DC
  Administered 2014-12-30 – 2014-12-31 (×2): 100 mg via ORAL
  Filled 2014-12-30 (×3): qty 1

## 2014-12-30 MED ORDER — DEXTROSE 5 % IV SOLN
1.0000 g | INTRAVENOUS | Status: DC
  Administered 2014-12-30 – 2014-12-31 (×2): 1 g via INTRAVENOUS
  Filled 2014-12-30 (×3): qty 10

## 2014-12-30 MED ORDER — ENOXAPARIN SODIUM 40 MG/0.4ML ~~LOC~~ SOLN
40.0000 mg | SUBCUTANEOUS | Status: DC
  Administered 2014-12-30 – 2014-12-31 (×2): 40 mg via SUBCUTANEOUS
  Filled 2014-12-30 (×2): qty 0.4

## 2014-12-30 MED ORDER — ACETAMINOPHEN 650 MG RE SUPP: 650.0000 mg | Freq: Four times a day (QID) | RECTAL | Status: DC | PRN

## 2014-12-30 MED ORDER — ONDANSETRON HCL 4 MG PO TABS: 4.0000 mg | ORAL_TABLET | Freq: Four times a day (QID) | ORAL | Status: DC | PRN

## 2014-12-30 MED ORDER — ACETAMINOPHEN 325 MG PO TABS: 650.0000 mg | ORAL_TABLET | Freq: Four times a day (QID) | ORAL | Status: DC | PRN

## 2014-12-30 MED ORDER — SODIUM CHLORIDE 0.9 % IV SOLN
INTRAVENOUS | Status: DC
  Administered 2014-12-30 (×2): via INTRAVENOUS

## 2014-12-30 MED ORDER — ONDANSETRON HCL 4 MG/2ML IJ SOLN: 4.0000 mg | Freq: Four times a day (QID) | INTRAMUSCULAR | Status: DC | PRN

## 2014-12-30 MED ORDER — ATORVASTATIN CALCIUM 40 MG PO TABS
40.0000 mg | ORAL_TABLET | Freq: Every day | ORAL | Status: DC
  Administered 2014-12-30 – 2014-12-31 (×2): 40 mg via ORAL
  Filled 2014-12-30 (×2): qty 1

## 2014-12-30 NOTE — Care Management Note (Signed)
Case Management Note  Patient Details  Name: Rodney Decker MRN: [id number] Date of Birth: 1940/04/30  Subjective/Objective:                  Pt admitted from home with UTI, and encephalopathy. Pt lives alone and will return home at discharge. Pt is independent with ADl's.  Action/Plan: No CM needs noted. Anticipate discharge within 24 hours.  Expected Discharge Date:                  Expected Discharge Plan:  Home/Self Care  In-House Referral:  Clinical Social Work  Discharge planning Services  CM Consult  Post Acute Care Choice:  NA Choice offered to:  NA  DME Arranged:    DME Agency:     HH Arranged:    [place] Agency:     Status of Service:  Completed, signed off  Medicare Important Message Given:    Date Medicare IM Given:    Medicare IM give by:    Date Additional Medicare IM Given:    Additional Medicare Important Message give by:     If discussed at [place] of Stay Meetings, dates discussed:    Additional Comments:  Joylene Draft, RN 12/30/2014, 1:10 PM

## 2014-12-30 NOTE — Progress Notes (Signed)
ANTIBIOTIC CONSULT NOTE - INITIAL  Pharmacy Consult for Rocephin Indication: UTI  Allergies  Allergen Reactions  . Claritin [Loratadine] Other (See Comments)    unknown  . Cymbalta [Duloxetine Hcl] Rash    Patient Measurements: Height: 5\' 11"  (180.3 cm) Weight: 185 lb 6.4 oz (84.097 kg) IBW/kg (Calculated) : 75.3 Adjusted Body Weight:   Vital Signs: Temp: 98.5 F (36.9 C) (09/26 0608) Temp Source: Oral (09/26 7106) BP: 132/64 mmHg (09/26 2694) Pulse Rate: 67 (09/26 0608) Intake/Output from previous day: 09/25 0701 - 09/26 0700 In: -  Out: 200 [Urine:200] Intake/Output from this shift:    Labs:  Recent Labs  12/27/14 1021 12/29/14 1554 12/30/14 0612  WBC 14.1* 22.8* 11.6*  HGB 15.3 15.5 13.6  PLT 258 281 243  CREATININE 0.80 1.03 0.98   Estimated Creatinine Clearance: 71.5 mL/min (by C-G formula based on Cr of 0.98). No results for input(s): VANCOTROUGH, VANCOPEAK, VANCORANDOM, GENTTROUGH, GENTPEAK, GENTRANDOM, TOBRATROUGH, TOBRAPEAK, TOBRARND, AMIKACINPEAK, AMIKACINTROU, AMIKACIN in the last 72 hours.   Microbiology: Recent Results (from the past 720 hour(s))  Blood culture (routine x 2)     Status: None (Preliminary result)   Collection Time: 12/29/14  5:45 PM  Result Value Ref Range Status   Specimen Description BLOOD RIGHT HAND  Final   Special Requests BOTTLES DRAWN AEROBIC AND ANAEROBIC 5 CC EACH  Final   Culture PENDING  Incomplete   Report Status PENDING  Incomplete  Blood culture (routine x 2)     Status: None (Preliminary result)   Collection Time: 12/29/14  6:00 PM  Result Value Ref Range Status   Specimen Description BLOOD RIGHT ARM  Final   Special Requests BOTTLES DRAWN AEROBIC AND ANAEROBIC 6 CC EACH  Final   Culture PENDING  Incomplete   Report Status PENDING  Incomplete    Medical History: Past Medical History  Diagnosis Date  . Hypertension   . CAD (coronary artery disease)   . Insomnia   . Depression   . Colon polyps   . GERD  (gastroesophageal reflux disease)   . Dyslipidemia   . Myocardial infarction 2011  . Arthritis   . Ruptured lumbar disc     Medications:  Scheduled:  . sodium chloride   Intravenous STAT  . atorvastatin  40 mg Oral Daily  . buPROPion  100 mg Oral Daily  . cefTRIAXone (ROCEPHIN)  IV  1 g Intravenous Once  . enoxaparin (LOVENOX) injection  40 mg Subcutaneous Q24H  . lisinopril  5 mg Oral Daily   Assessment: 74 yo male  With PMH of HTN, CAD GERD, Dyslipidemia, MID admitted for complaints of fall. UA with leukocytosis, clear, few bacteria, neg for leukocytes and nitrates.  Goal of Therapy:  resolution of UTI  Plan:  Rocephin 1gm IV q24h Follow up culture results   Isac Sarna, BS Vena [place], BCPS Clinical Pharmacist Pager [phone]  12/30/2014,8:49 AM

## 2014-12-30 NOTE — Progress Notes (Signed)
Pt pulled IV out.  New one put in, pt tolerated well.

## 2014-12-30 NOTE — Progress Notes (Signed)
[facility] PROGRESS NOTE  Rodney Decker GMW:[id number] DOB: 06-30-1940 DOA: 12/29/2014 PCP: Rodney Decker., MD  Assessment/Plan:  1. Acute encephalopathy, likely UTI vs benzodiazepine withdrawal. Has not taken Ativan for the last week, typically takes it 1-2x a day for the last year. Head CT unremarkable. UC and BC pending. UA shows hematuria. WBC 22.8 down to 11.6. He is alert and oriented x3 although may still have some residual confusion. Will monitor for another 24 hours. PT evaluated and recommends patient ambulate with a cane or walker upon discharge. 2. Possible UTI.   WBC was 22.8, now 11.6. Afebrile. UC pending however will continue to treat empirically with Rocephin.  3. Fall, possibly secondary to encephalopathy. Head CT and xrays unremarkable.  4. Possible benzodiazepine withdrawal. Continue on Ativan.    Code Status: Full DVT Prophylaxis: Lovenox Family Communication: No family at bedside. Discussed with patient who understands and has no concerns at this time. Disposition Plan: Anticipate discharge within 24-48 hours.    Consultants:  PT  Procedures:    Antibiotics:  Rocephin 9/25>>  HPI/Subjective: Feels better. Believes he has an abscessed tooth on the left side of his mouth which is painful. Has had subjective fevers. Denies any urinary symptoms. Has back pain which he believes is from his fall yesterday.   Objective: Filed Vitals:   12/30/14 0608  BP: 132/64  Pulse: 67  Temp: 98.5 F (36.9 C)  Resp: 16    Intake/Output Summary (Last 24 hours) at 12/30/14 0721 Last data filed at 12/30/14 0056  Gross per 24 hour  Intake      0 ml  Output    200 ml  Net   -200 ml   Filed Weights   12/29/14 1429 12/30/14 0020  Weight: 77.111 kg (170 lb) 84.097 kg (185 lb 6.4 oz)    Exam:  General: NAD, looks comfortable Cardiovascular: RRR, S1, S2  Respiratory: clear bilaterally, No wheezing, rales or rhonchi Abdomen: soft, non tender, no  distention , bowel sounds normal Neurological: Oriented to person, place, and time.  Musculoskeletal: No edema b/l  Data Reviewed: Basic Metabolic Panel:  Recent Labs Lab 12/27/14 1021 12/29/14 1554 12/30/14 0612  NA 132* 135 139  K 4.5 3.7 4.0  CL 101 101 107  CO2 24 26 27   GLUCOSE 111* 109* 98  BUN 28* 21* 14  CREATININE 0.80 1.03 0.98  CALCIUM 9.8 9.5 9.0   Liver Function Tests:  Recent Labs Lab 12/27/14 1021 12/29/14 1554 12/30/14 0612  AST 17 24 30   ALT 18 22 21   ALKPHOS 92 90 76  BILITOT 0.6 1.0 1.3*  PROT 7.3 7.3 6.2*  ALBUMIN 4.2 4.3 3.6   CBC:  Recent Labs Lab 12/27/14 1021 12/29/14 1554 12/30/14 0612  WBC 14.1* 22.8* 11.6*  NEUTROABS 11.6* 20.6*  --   HGB 15.3 15.5 13.6  HCT 44.5 45.3 40.7  MCV 89.9 90.2 91.3  PLT 258 281 243   Cardiac Enzymes:  Recent Labs Lab 12/29/14 1554  TROPONINI <0.03    CBG:  Recent Labs Lab 12/29/14 1437  GLUCAP 135*    Recent Results (from the past 240 hour(s))  Blood culture (routine x 2)     Status: None (Preliminary result)   Collection Time: 12/29/14  5:45 PM  Result Value Ref Range Status   Specimen Description BLOOD RIGHT HAND  Final   Special Requests BOTTLES DRAWN AEROBIC AND ANAEROBIC 5 CC EACH  Final   Culture PENDING  Incomplete   Report Status  PENDING  Incomplete  Blood culture (routine x 2)     Status: None (Preliminary result)   Collection Time: 12/29/14  6:00 PM  Result Value Ref Range Status   Specimen Description BLOOD RIGHT ARM  Final   Special Requests BOTTLES DRAWN AEROBIC AND ANAEROBIC 6 CC EACH  Final   Culture PENDING  Incomplete   Report Status PENDING  Incomplete     Studies: Dg Chest 2 View  12/30/2014   CLINICAL DATA:  Syncope.  Elevated white count.  EXAM: CHEST  2 VIEW  COMPARISON:  06/19/2014  FINDINGS: Chronic hyperinflation and interstitial coarsening. There is no edema, consolidation, effusion, or pneumothorax.  Chronic cardiomegaly and aortic tortuosity, distorted  by leftward rotation. CABG changes noted.  IMPRESSION: COPD without acute superimposed process.   Electronically Signed   By: Rodney Decker M.D.   On: 12/30/2014 00:02   Dg Ribs Unilateral W/chest Right  12/29/2014   CLINICAL DATA:  Recurrent falls including tendon today with mid to lower anterior rib pain. Initial encounter.  EXAM: RIGHT RIBS AND CHEST - 3+ VIEW  COMPARISON:  CT chest 12/11/2014.  PA and lateral chest 06/19/2014.  FINDINGS: There is no pneumothorax. Heart size is upper normal. The patient is status post CABG. Mild left basilar atelectasis is noted. No rib fracture is identified.  IMPRESSION: Negative for fracture.  No acute cardiopulmonary disease.   Electronically Signed   By: Rodney Decker M.D.   On: 12/29/2014 16:53   Ct Head Wo Contrast  12/30/2014   CLINICAL DATA:  Syncope. Two falls today. Weakness, dizziness and headache since the fall.  EXAM: CT HEAD WITHOUT CONTRAST  TECHNIQUE: Contiguous axial images were obtained from the base of the skull through the vertex without intravenous contrast.  COMPARISON:  None.  FINDINGS: No intracranial hemorrhage, mass effect, or midline shift. No hydrocephalus. The basilar cisterns are patent. Mild generalized atrophy. No evidence of territorial infarct. No intracranial fluid collection. Calvarium is intact. Included paranasal sinuses and mastoid air cells are well aerated. Probable left maxillary sinus mucous retention cyst, partially included.  IMPRESSION: No acute intracranial abnormality.   Electronically Signed   By: Rodney Decker M.D.   On: 12/30/2014 00:02    Scheduled Meds: . sodium chloride   Intravenous STAT  . atorvastatin  40 mg Oral Daily  . buPROPion  100 mg Oral Daily  . cefTRIAXone (ROCEPHIN)  IV  1 g Intravenous Once  . enoxaparin (LOVENOX) injection  40 mg Subcutaneous Q24H  . lisinopril  5 mg Oral Daily   Continuous Infusions: . sodium chloride 75 mL/hr at 12/30/14 0048    Active Problems:   Syncope    Altered mental status   UTI (lower urinary tract infection)    Time spent: 25 minutes   Rodney Decker. MD  [facility] Pager [phone]. If 7PM-7AM, please contact night-coverage at [email or web address], password [facility] 12/30/2014, 7:21 AM  LOS: 1 day     By signing my name below, I, Rosalie Doctor, attest that this documentation has been prepared under the direction and in the presence of [facility]. MD Electronically Signed: Rosalie Doctor, Scribe. 12/30/2014 12:09pm  I, Dr. Kathie Dike, personally performed the services described in this documentaiton. All medical record entries made by the scribe were at my direction and in my presence. I have reviewed the chart and agree that the record reflects my personal performance and is accurate and complete  Kathie Dike, MD, 12/30/2014 12:21 PM

## 2014-12-30 NOTE — Evaluation (Signed)
Physical Therapy Evaluation Patient Details Name: Rodney Decker MRN: [id number] DOB: [birth year] Today's Date: 12/30/2014   History of Present Illness  74 year old male who  has a past medical history of Hypertension; CAD (coronary artery disease); Insomnia; Depression; Colon polyps; GERD (gastroesophageal reflux disease); Dyslipidemia; Myocardial infarction (2011); Arthritis; and Ruptured lumbar disc.  He came to the ER secondary to having a fall.  He is being referred to PT to assess his safety in ambulation.   Clinical Impression  Pt normally ambulates without assistive device.  He feels he fell secondary to the pain in his left foot due to a bunion.  At this time pt feels more comfortable ambulating with a rolling walker although no instability was noted with ambulation without any assistive device.  We will assess how pt feels and looks ambulating with a straight cane.  At that point I assume that he will be ready for discharge from skilled PT care.     Follow Up Recommendations No PT follow up    Equipment Recommendations    cane vs. walker   Recommendations for Other Services   none    Precautions / Restrictions Precautions Precautions: Fall Restrictions Weight Bearing Restrictions: No      Mobility  Bed Mobility Overal bed mobility: Independent                Transfers Overall transfer level: Independent                  Ambulation/Gait Ambulation/Gait assistance: Modified independent (Device/Increase time) Ambulation Distance (Feet): 220 Feet Assistive device: Rolling walker (2 wheeled) (ambulated 50 feet with no assistive device ) Gait Pattern/deviations: WFL(Within Functional Limits)   Gait velocity interpretation: at or above normal speed for age/gender                     Pertinent Vitals/Pain Pain Assessment: 0-10 Pain Score: 3  Pain Location: chest Pain Descriptors / Indicators: Aching    Home Living Family/patient expects  to be discharged to:: Private residence Living Arrangements: Alone Available Help at Discharge: Other (Comment) (none reliable) Type of Home: House Home Access: Stairs to enter Entrance Stairs-Rails: Right Entrance Stairs-Number of Steps: 3 Home Layout: Two level;Able to live on main level with bedroom/bathroom Home Equipment: None Additional Comments: Has mothers walker    Prior Function Level of Independence: Independent               Hand Dominance   Dominant Hand: Right    Extremity/Trunk Assessment    wnl                     Communication   Communication: No difficulties  Cognition Arousal/Alertness: Awake/alert Behavior During Therapy: WFL for tasks assessed/performed Overall Cognitive Status: Within Functional Limits for tasks assessed                               Assessment/Plan    PT Assessment Patient needs continued PT services  PT Diagnosis Difficulty walking   PT Problem List Decreased mobility (will attempt ambulation again with a cane to see if pt  is safer to ambulate with cane vs. no assistive device.)  PT Treatment Interventions Gait training   PT Goals (Current goals can be found in the Care Plan section) Acute Rehab PT Goals PT Goal Formulation: With patient Time For Goal Achievement: 01/01/15 Potential to Achieve Goals: Good  Frequency Min 2X/week           End of Session Equipment Utilized During Treatment: Gait belt Activity Tolerance: Patient tolerated treatment well Patient left: in chair      Functional Limitation: Mobility: Walking and moving around Mobility: Walking and Moving Around Current Status (V3612): At least 1 percent but less than 20 percent impaired, limited or restricted Mobility: Walking and Moving Around Goal Status [phone]): At least 1 percent but less than 20 percent impaired, limited or restricted Mobility: Walking and Moving Around Discharge Status [phone]): At least 1 percent but less  than 20 percent impaired, limited or restricted    Time: 1120-1152 PT Time Calculation (min) (ACUTE ONLY): 32 min   Charges:   PT Evaluation $Initial PT Evaluation Tier I: 1 Procedure     PT G Codes:   PT G-Codes **NOT FOR INPATIENT CLASS** Functional Limitation: Mobility: Walking and moving around Mobility: Walking and Moving Around Current Status (R1021): At least 1 percent but less than 20 percent impaired, limited or restricted Mobility: Walking and Moving Around Goal Status [phone]): At least 1 percent but less than 20 percent impaired, limited or restricted Mobility: Walking and Moving Around Discharge Status [phone]): At least 1 percent but less than 20 percent impaired, limited or restricted    Rayetta Humphrey, PT CLT [phone] 12/30/2014, 11:52 AM

## 2014-12-30 NOTE — ED Provider Notes (Signed)
CRITICAL CARE Performed by: Nat Christen Total critical care time: 30 Critical care time was exclusive of separately billable procedures and treating other patients. Critical care was necessary to treat or prevent imminent or life-threatening deterioration. Critical care was time spent personally by me on the following activities: development of treatment plan with patient and/or surrogate as well as nursing, discussions with consultants, evaluation of patient's response to treatment, examination of patient, obtaining history from patient or surrogate, ordering and performing treatments and interventions, ordering and review of laboratory studies, ordering and review of radiographic studies, pulse oximetry and re-evaluation of patient's condition.  Nat Christen, MD 12/30/14 1530

## 2014-12-30 NOTE — Clinical Social Work Note (Signed)
CSW received consult for possible placement. CSW met with pt at bedside. Pt alert and oriented and reports he lives alone. He has a neighbor who checks on him regularly. Pt states he fell twice at home and called EMS. He feels he manages fine at home and is anxious to d/c as soon as possible. PT evaluated pt and no follow up needed. Pt plans to return home at d/c. Will sign off, but can be reconsulted if needed.  Benay Pike, [place]

## 2014-12-31 DIAGNOSIS — F13939 Sedative, hypnotic or anxiolytic use, unspecified with withdrawal, unspecified: Secondary | ICD-10-CM | POA: Diagnosis present

## 2014-12-31 DIAGNOSIS — W19XXXA Unspecified fall, initial encounter: Secondary | ICD-10-CM | POA: Diagnosis present

## 2014-12-31 DIAGNOSIS — W19XXXD Unspecified fall, subsequent encounter: Secondary | ICD-10-CM

## 2014-12-31 DIAGNOSIS — G9340 Encephalopathy, unspecified: Secondary | ICD-10-CM | POA: Diagnosis present

## 2014-12-31 DIAGNOSIS — F13239 Sedative, hypnotic or anxiolytic dependence with withdrawal, unspecified: Secondary | ICD-10-CM | POA: Diagnosis present

## 2014-12-31 LAB — URINE CULTURE: Culture: NO GROWTH

## 2014-12-31 MED ORDER — LORAZEPAM 2 MG PO TABS: 2.0000 mg | ORAL_TABLET | Freq: Two times a day (BID) | ORAL | Status: DC

## 2014-12-31 NOTE — Progress Notes (Signed)
Physical Therapy Treatment Patient Details Name: Rodney Decker MRN: [id number] DOB: July 24, 1940 Today's Date: 12/31/2014    History of Present Illness 74 year old male who  has a past medical history of Hypertension; CAD (coronary artery disease); Insomnia; Depression; Colon polyps; GERD (gastroesophageal reflux disease); Dyslipidemia; Myocardial infarction (2011); Arthritis; and Ruptured lumbar disc.  He came to the ER secondary to having a fall.  He is being referred to PT to assess his safety in ambulation.     PT Comments    Pt received a cane from DME source.  It was fitted to him and he was instructed in its' use.  He was able to use it for ambulation with a stable gait pattern.  He was advised to use either his cane or walker as needed, depending on how he was feeling.  Follow Up Recommendations  No PT follow up     Equipment Recommendations  Cane    Recommendations for Other Services  none     Precautions / Restrictions Precautions Precautions: Fall Restrictions Weight Bearing Restrictions: No    Mobility  Bed Mobility Overal bed mobility: Independent                Transfers Overall transfer level: Independent                  Ambulation/Gait Ambulation/Gait assistance: Modified independent (Device/Increase time) Ambulation Distance (Feet): 200 Feet Assistive device: Straight cane Gait Pattern/deviations: WFL(Within Functional Limits)   Gait velocity interpretation: >2.62 ft/sec, indicative of independent Presenter, broadcasting Rankin (Stroke Patients Only)       Balance Overall balance assessment: Needs assistance Sitting-balance support: No upper extremity supported;Feet supported Sitting balance-Leahy Scale: Good     Standing balance support: Single extremity supported Standing balance-Leahy Scale: Good                      Cognition Arousal/Alertness:  Awake/alert Behavior During Therapy: WFL for tasks assessed/performed Overall Cognitive Status: Within Functional Limits for tasks assessed                      Exercises      General Comments        Pertinent Vitals/Pain Pain Assessment: No/denies pain    Home Living                      Prior Function            PT Goals (current goals can now be found in the care plan section) Progress towards PT goals: Goals met/education completed, patient discharged from PT    Frequency  Min 2X/week    PT Plan Current plan remains appropriate    Co-evaluation             End of Session Equipment Utilized During Treatment: Gait belt Activity Tolerance: Patient tolerated treatment well Patient left: in chair;with call bell/phone within reach;with chair alarm set     Time: 3818-2993 PT Time Calculation (min) (ACUTE ONLY): 20 min  Charges:  $Gait Training: 8-22 mins                    G CodesSable Feil  PT 12/31/2014, 11:50 AM [phone]

## 2014-12-31 NOTE — Discharge Summary (Addendum)
Physician Discharge Summary  Rodney Decker CNO:[id number] DOB: [birth year] DOA: 12/29/2014  PCP: Glo Herring., MD  Admit date: 12/29/2014 Discharge date: 12/31/2014  Time spent: 35 minutes  Recommendations for Outpatient Follow-up:  1. Follow up with PCP in 1-2 weeks for resolution of acute encephalopathy  Discharge Diagnoses:  Principal Problem:   Acute encephalopathy Active Problems:   Syncope   Fall   Benzodiazepine withdrawal   Discharge Condition: Improved  Diet recommendation: Heart healthy   Filed Weights   12/29/14 1429 12/30/14 0020  Weight: 77.111 kg (170 lb) 84.097 kg (185 lb 6.4 oz)    History of present illness:  81 yom with history of HTN, CAD, insomnia and GERD presented with complaints of fall and hitting his right chest on a wall. While in the ED found to have acute encephalopathy and possible UTI. Was admitted for further evaluation and monitoring.   Hospital Course:  Acute encephalopathy, possibly benzodiazepine withdrawal. Has resolved, he appears to be at baseline. Patient was initially started on rocephin for possible UTI, but UC has shown no growth. Reported he has not taken Ativan for the last week, typically takes it 1-2x a day for the last year. Head CT unremarkable. He is afebrile and does not appear to have any other signs of infection. PT evaluated and recommended patient ambulate with a cane or walker upon discharge. He is otherwise stable for discharge.  Fall, possibly secondary to encephalopathy. Head CT and xrays were unremarkable. Seen by PT.  Possible benzodiazepine withdrawal. Continued on Ativan. Follow up with PCP.   Procedures:  None  Consultations:  None   Discharge Exam: Filed Vitals:   12/31/14 0529  BP: 139/60  Pulse: 70  Temp: 98.7 F (37.1 C)  Resp: 18     General: NAD, looks comfortable  Cardiovascular: RRR, S1, S2   Respiratory: clear bilaterally, No wheezing, rales or rhnochi  Abdomen: soft, non  tender, no distention , bowel sounds normal  Musculoskeletal: No edema b/l  Psychiatric: Oriented x3   Discharge Instructions   Discharge Instructions    Diet - low sodium heart healthy    Complete by:  As directed      Increase activity slowly    Complete by:  As directed           Current Discharge Medication List    CONTINUE these medications which have CHANGED   Details  LORazepam (ATIVAN) 2 MG tablet Take 1 tablet (2 mg total) by mouth 2 (two) times daily. Qty: 30 tablet, Refills: 0      CONTINUE these medications which have NOT CHANGED   Details  atorvastatin (LIPITOR) 40 MG tablet Take 1 tablet (40 mg total) by mouth daily. Qty: 30 tablet, Refills: 6   Associated Diagnoses: Coronary atherosclerosis of unspecified type of vessel, native or graft    buPROPion (WELLBUTRIN XL) 300 MG 24 hr tablet Take 300 mg by mouth daily.    lisinopril (PRINIVIL,ZESTRIL) 5 MG tablet Take 5 mg by mouth daily.     Oxycodone HCl 10 MG TABS Take 10 mg by mouth every 4 (four) hours as needed (pain).    promethazine (PHENERGAN) 25 MG tablet Take 25 mg by mouth every 6 (six) hours as needed for nausea or vomiting.    traMADol (ULTRAM) 50 MG tablet Take by mouth every 6 (six) hours as needed for moderate pain.     aspirin EC 81 MG tablet Take 81 mg by mouth daily.    nitroGLYCERIN (NITROSTAT) 0.4  MG SL tablet Place 1 tablet (0.4 mg total) under the tongue every 5 (five) minutes as needed for chest pain. Qty: 25 tablet, Refills: 3      STOP taking these medications     buPROPion (WELLBUTRIN) 100 MG tablet        Allergies  Allergen Reactions  . Claritin [Loratadine] Other (See Comments)    unknown  . Cymbalta [Duloxetine Hcl] Rash      The results of significant diagnostics from this hospitalization (including imaging, microbiology, ancillary and laboratory) are listed below for reference.    Significant Diagnostic Studies: Dg Chest 2 View  12/30/2014   CLINICAL DATA:   Syncope.  Elevated white count.  EXAM: CHEST  2 VIEW  COMPARISON:  06/19/2014  FINDINGS: Chronic hyperinflation and interstitial coarsening. There is no edema, consolidation, effusion, or pneumothorax.  Chronic cardiomegaly and aortic tortuosity, distorted by leftward rotation. CABG changes noted.  IMPRESSION: COPD without acute superimposed process.   Electronically Signed   By: Monte Fantasia M.D.   On: 12/30/2014 00:02   Dg Ribs Unilateral W/chest Right  12/29/2014   CLINICAL DATA:  Recurrent falls including tendon today with mid to lower anterior rib pain. Initial encounter.  EXAM: RIGHT RIBS AND CHEST - 3+ VIEW  COMPARISON:  CT chest 12/11/2014.  PA and lateral chest 06/19/2014.  FINDINGS: There is no pneumothorax. Heart size is upper normal. The patient is status post CABG. Mild left basilar atelectasis is noted. No rib fracture is identified.  IMPRESSION: Negative for fracture.  No acute cardiopulmonary disease.   Electronically Signed   By: Inge Rise M.D.   On: 12/29/2014 16:53   Ct Head Wo Contrast  12/30/2014   CLINICAL DATA:  Syncope. Two falls today. Weakness, dizziness and headache since the fall.  EXAM: CT HEAD WITHOUT CONTRAST  TECHNIQUE: Contiguous axial images were obtained from the base of the skull through the vertex without intravenous contrast.  COMPARISON:  None.  FINDINGS: No intracranial hemorrhage, mass effect, or midline shift. No hydrocephalus. The basilar cisterns are patent. Mild generalized atrophy. No evidence of territorial infarct. No intracranial fluid collection. Calvarium is intact. Included paranasal sinuses and mastoid air cells are well aerated. Probable left maxillary sinus mucous retention cyst, partially included.  IMPRESSION: No acute intracranial abnormality.   Electronically Signed   By: Jeb Levering M.D.   On: 12/30/2014 00:02   Ct Chest Wo Contrast  12/11/2014   CLINICAL DATA:  Sternal chest pain and intermittent distal sternal bulge for 6 months.  Prior CABG. Cough.  EXAM: CT CHEST WITHOUT CONTRAST  TECHNIQUE: Multidetector CT imaging of the chest was performed following the standard protocol without IV contrast.  COMPARISON:  Chest radiographs 06/19/2014 and CT 02/12/2011  FINDINGS: No enlarged axillary, mediastinal, or hilar lymph nodes are identified. Mild aortic and coronary artery calcification are noted. There is no pleural or pericardial effusion.  Mild-to-moderate centrilobular emphysema is noted. Areas of mild subpleural reticulation and mild ground-glass opacity are similar to the prior CT and may reflect mild fibrotic change.  4 mm right upper lobe nodule (series 4, image 32), 4 mm right middle lobe nodule (image 38), 5 mm right lower lobe nodule (image 49), 6 mm right lower lobe nodule (image 40), 9 mm nodule along the right major fissure (image 38), 4 mm and 3 mm nodules just posterior to the right major fissure (images 37 and 39), 3 mm left upper lobe nodule (image 15), and 8 mm lingular nodule/focal atelectasis (image  38) are all unchanged and considered benign. No new lung nodules are identified.  Sequelae of prior CABG are identified. The inferior-most sternal wire is again seen to be fractured. The sternotomy is approximated superiorly with some areas of solid bone traversing the sternotomy. There is mild residual distraction and nonunion at the inferior aspect of the sternotomy which is similar to the prior CT. 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 The tip of the left xiphoid process is approximately 1 cm below the skin surface and is in the region of clinical concern indicated by the vitamin E marker placed on the skin. The configuration of the xiphoid process has not significantly changed from 2012. Immediately inferior to the tip of the left the xiphoid process is a small fat containing midline ventral upper abdominal wall hernia which measures 2.8 cm in diameter  and is unchanged from 2012.  IMPRESSION: 1. Stable appearance of the sternum with changes of prior median sternotomy as above. 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 2. Small fat containing upper abdominal ventral hernia just below the tip of the left-sided xiphoid process, also unchanged. 3. Unchanged lung nodules.  Emphysema.   Electronically Signed   By: Logan Bores M.D.   On: 12/11/2014 14:34    Microbiology: Recent Results (from the past 240 hour(s))  Blood culture (routine x 2)     Status: None (Preliminary result)   Collection Time: 12/29/14  5:45 PM  Result Value Ref Range Status   Specimen Description BLOOD RIGHT HAND  Final   Special Requests BOTTLES DRAWN AEROBIC AND ANAEROBIC 5 CC EACH  Final   Culture NO GROWTH 2 DAYS  Final   Report Status PENDING  Incomplete  Blood culture (routine x 2)     Status: None (Preliminary result)   Collection Time: 12/29/14  6:00 PM  Result Value Ref Range Status   Specimen Description BLOOD RIGHT ARM  Final   Special Requests BOTTLES DRAWN AEROBIC AND ANAEROBIC 6 CC EACH  Final   Culture NO GROWTH 2 DAYS  Final   Report Status PENDING  Incomplete  Urine culture     Status: None (Preliminary result)   Collection Time: 12/29/14  6:25 PM  Result Value Ref Range Status   Specimen Description URINE, CATHETERIZED  Final   Special Requests NONE  Final   Culture   Final    NO GROWTH < 24 HOURS Performed at [facility]    Report Status PENDING  Incomplete     Labs: Basic Metabolic Panel:  Recent Labs Lab 12/27/14 1021 12/29/14 1554 12/30/14 0612  NA 132* 135 139  K 4.5 3.7 4.0  CL 101 101 107  CO2 24 26 27   GLUCOSE 111* 109* 98  BUN 28* 21* 14  CREATININE 0.80 1.03 0.98  CALCIUM 9.8 9.5 9.0   Liver Function Tests:  Recent Labs Lab 12/27/14 1021 12/29/14 1554 12/30/14 0612  AST 17 24 30   ALT 18 22 21   ALKPHOS 92 90 76   BILITOT 0.6 1.0 1.3*  PROT 7.3 7.3 6.2*  ALBUMIN 4.2 4.3 3.6   No results for input(s): LIPASE, AMYLASE in the last 168 hours. No results for input(s): AMMONIA in the last 168 hours. CBC:  Recent Labs Lab 12/27/14 1021 12/29/14 1554 12/30/14 0612  WBC 14.1* 22.8* 11.6*  NEUTROABS 11.6* 20.6*  --  HGB 15.3 15.5 13.6  HCT 44.5 45.3 40.7  MCV 89.9 90.2 91.3  PLT 258 281 243   Cardiac Enzymes:  Recent Labs Lab 12/29/14 1554  TROPONINI <0.03   CBG:  Recent Labs Lab 12/29/14 1437  GLUCAP 135*       Signed:    Kathie Dike, MD [facility] 12/31/2014, 11:07 AM  By signing my name below, I, Rennis Harding, attest that this documentation has been prepared under the direction and in the presence of Kathie Dike, MD. Electronically signed: Rennis Harding, Scribe. 12/31/2014 10:05 AM  I, Dr. Kathie Dike, personally performed the services described in this documentaiton. All medical record entries made by the scribe were at my direction and in my presence. I have reviewed the chart and agree that the record reflects my personal performance and is accurate and complete  Kathie Dike, MD, 12/31/2014 11:07 AM

## 2014-12-31 NOTE — Care Management Important Message (Signed)
Important Message  Patient Details  Name: Rodney Decker MRN: [id number] Date of Birth: 1940-05-02   Medicare Important Message Given:  Yes-second notification given    Joylene Draft, RN 12/31/2014, 10:51 AM

## 2014-12-31 NOTE — Progress Notes (Signed)
Central Telemetry called, notified patient going home, telemetry removed.  IV out by [employer].  Discharge instructions reviewed with patient. Cane in room delivered by [employer] for discharge to home.  Patient called cab for transfer home, waiting ride.

## 2014-12-31 NOTE — Care Management Note (Signed)
Case Management Note  Patient Details  Name: Rodney Decker MRN: [id number] Date of Birth: 1940-06-16  Subjective/Objective:                    Action/Plan:   Expected Discharge Date:                  Expected Discharge Plan:  Home/Self Care  In-House Referral:  Clinical Social Work  Discharge planning Services  CM Consult  Post Acute Care Choice:  Durable Medical Equipment Choice offered to:  Patient  DME Arranged:  Kasandra Knudsen DME Agency:  [place]:    [facility] Agency:     Status of Service:  Completed, signed off  Medicare Important Message Given:    Date Medicare IM Given:    Medicare IM give by:    Date Additional Medicare IM Given:    Additional Medicare Important Message give by:     If discussed at [place] of Stay Meetings, dates discussed:    Additional Comments: Pt discharged home today. [facility] to deliver cane to pts room prior to discharge (per pts choice). Romualdo Bolk of [facility] is aware and will deliver pts cane. Pt and pts nurse aware of discharge arrangements. Christinia Gully [place], RN 12/31/2014, 10:43 AM

## 2015-01-02 DIAGNOSIS — N400 Enlarged prostate without lower urinary tract symptoms: Secondary | ICD-10-CM | POA: Diagnosis not present

## 2015-01-02 DIAGNOSIS — S20219D Contusion of unspecified front wall of thorax, subsequent encounter: Secondary | ICD-10-CM | POA: Diagnosis not present

## 2015-01-02 DIAGNOSIS — Z1389 Encounter for screening for other disorder: Secondary | ICD-10-CM | POA: Diagnosis not present

## 2015-01-02 DIAGNOSIS — Z23 Encounter for immunization: Secondary | ICD-10-CM | POA: Diagnosis not present

## 2015-01-02 DIAGNOSIS — N529 Male erectile dysfunction, unspecified: Secondary | ICD-10-CM | POA: Diagnosis not present

## 2015-01-02 DIAGNOSIS — Z6829 Body mass index (BMI) 29.0-29.9, adult: Secondary | ICD-10-CM | POA: Diagnosis not present

## 2015-01-02 DIAGNOSIS — R410 Disorientation, unspecified: Secondary | ICD-10-CM | POA: Diagnosis not present

## 2015-01-04 LAB — CULTURE, BLOOD (ROUTINE X 2)
CULTURE: NO GROWTH
Culture: NO GROWTH

## 2015-01-21 ENCOUNTER — Ambulatory Visit (HOSPITAL_COMMUNITY)
Admission: RE | Admit: 2015-01-21 | Discharge: 2015-01-21 | Disposition: A | Discharge status: Home / Self Care | Payer: Medicare Other | Source: Ambulatory Visit | Attending: Internal Medicine | Admitting: Internal Medicine

## 2015-01-21 ENCOUNTER — Other Ambulatory Visit (HOSPITAL_COMMUNITY): Payer: Self-pay | Admitting: Internal Medicine

## 2015-01-21 DIAGNOSIS — R1012 Left upper quadrant pain: Secondary | ICD-10-CM | POA: Diagnosis not present

## 2015-01-21 DIAGNOSIS — R079 Chest pain, unspecified: Secondary | ICD-10-CM

## 2015-01-21 DIAGNOSIS — F419 Anxiety disorder, unspecified: Secondary | ICD-10-CM | POA: Diagnosis not present

## 2015-01-21 DIAGNOSIS — G894 Chronic pain syndrome: Secondary | ICD-10-CM | POA: Diagnosis not present

## 2015-01-21 DIAGNOSIS — Z1389 Encounter for screening for other disorder: Secondary | ICD-10-CM | POA: Diagnosis not present

## 2015-01-21 DIAGNOSIS — S20219A Contusion of unspecified front wall of thorax, initial encounter: Secondary | ICD-10-CM | POA: Diagnosis not present

## 2015-01-21 DIAGNOSIS — I10 Essential (primary) hypertension: Secondary | ICD-10-CM | POA: Diagnosis not present

## 2015-01-21 DIAGNOSIS — E6609 Other obesity due to excess calories: Secondary | ICD-10-CM | POA: Diagnosis not present

## 2015-01-21 DIAGNOSIS — Z6830 Body mass index (BMI) 30.0-30.9, adult: Secondary | ICD-10-CM | POA: Diagnosis not present

## 2015-02-03 DIAGNOSIS — H524 Presbyopia: Secondary | ICD-10-CM | POA: Diagnosis not present

## 2015-02-03 DIAGNOSIS — H52223 Regular astigmatism, bilateral: Secondary | ICD-10-CM | POA: Diagnosis not present

## 2015-02-03 DIAGNOSIS — H5203 Hypermetropia, bilateral: Secondary | ICD-10-CM | POA: Diagnosis not present

## 2015-02-03 DIAGNOSIS — H35361 Drusen (degenerative) of macula, right eye: Secondary | ICD-10-CM | POA: Diagnosis not present

## 2015-02-11 DIAGNOSIS — F419 Anxiety disorder, unspecified: Secondary | ICD-10-CM | POA: Diagnosis not present

## 2015-02-11 DIAGNOSIS — I10 Essential (primary) hypertension: Secondary | ICD-10-CM | POA: Diagnosis not present

## 2015-02-11 DIAGNOSIS — E782 Mixed hyperlipidemia: Secondary | ICD-10-CM | POA: Diagnosis not present

## 2015-02-11 DIAGNOSIS — M2010 Hallux valgus (acquired), unspecified foot: Secondary | ICD-10-CM | POA: Diagnosis not present

## 2015-02-11 DIAGNOSIS — Z1389 Encounter for screening for other disorder: Secondary | ICD-10-CM | POA: Diagnosis not present

## 2015-02-11 DIAGNOSIS — N400 Enlarged prostate without lower urinary tract symptoms: Secondary | ICD-10-CM | POA: Diagnosis not present

## 2015-03-07 DIAGNOSIS — M205X2 Other deformities of toe(s) (acquired), left foot: Secondary | ICD-10-CM | POA: Diagnosis not present

## 2015-03-07 DIAGNOSIS — M205X1 Other deformities of toe(s) (acquired), right foot: Secondary | ICD-10-CM | POA: Diagnosis not present

## 2015-03-11 ENCOUNTER — Ambulatory Visit (INDEPENDENT_AMBULATORY_CARE_PROVIDER_SITE_OTHER): Payer: Medicare Other | Admitting: Internal Medicine

## 2015-03-11 ENCOUNTER — Encounter (INDEPENDENT_AMBULATORY_CARE_PROVIDER_SITE_OTHER): Payer: Self-pay | Admitting: Internal Medicine

## 2015-03-11 VITALS — BP 130/78 | HR 68 | Temp 97.9°F | Resp 18 | Ht 71.0 in | Wt 191.1 lb

## 2015-03-11 DIAGNOSIS — I25812 Atherosclerosis of bypass graft of coronary artery of transplanted heart without angina pectoris: Secondary | ICD-10-CM | POA: Diagnosis not present

## 2015-03-11 DIAGNOSIS — Z86010 Personal history of colonic polyps: Secondary | ICD-10-CM

## 2015-03-11 DIAGNOSIS — K432 Incisional hernia without obstruction or gangrene: Secondary | ICD-10-CM | POA: Diagnosis not present

## 2015-03-11 NOTE — Progress Notes (Signed)
Presenting complaint;  Upper abdominal swelling. Patient concerned that he has hernia.  Subjective:  Patient is 74 year old Caucasian male who is here for scheduled visit. He was last seen in August 2015 for constipation and underwent colonoscopy a month later and found to have adenomas as well as sessile serrated polyps. He is not having any bowel problems. He states his bowels move every day. He denies melena or rectal bleeding. He has multiple complaints. He says his appetite is not normal. He has lost 18 pounds in the last 15 months. He is aware of muscle wasting. He denies myalgias. He is not having nausea vomiting or dysphagia. He complains of a bulge in upper abdomen. He has some pain only when he coughs. He also has been told that low part of his sternum is split because of loose suture. He is not having any pain in this area. He complains of pain in his feet. He is planned to have surgery on 1 foot. He also complains of back pain and anxiety. He still feels he needs higher dose of lorazepam. He also admits that he is taking an extra dose here and there. He is fully aware that he should not do that. He states he will bring this up with Dr. Gerarda Fraction on his next visit. He states he has mild hypercalcemia and has undergone test by Dr. Gerarda Fraction but does not know any details.   Current Medications: Outpatient Encounter Prescriptions as of 03/11/2015  Medication Sig  . aspirin EC 81 MG tablet Take 81 mg by mouth daily.  Marland Kitchen atorvastatin (LIPITOR) 40 MG tablet Take 1 tablet (40 mg total) by mouth daily.  Marland Kitchen buPROPion (WELLBUTRIN XL) 300 MG 24 hr tablet Take 300 mg by mouth daily.  Marland Kitchen HYDROcodone-acetaminophen (NORCO) 10-325 MG tablet TK 1 T PO Q 3 H PRN. MAX 6 PER DAY  . lisinopril (PRINIVIL,ZESTRIL) 5 MG tablet Take 5 mg by mouth daily.   Marland Kitchen LORazepam (ATIVAN) 2 MG tablet Take 1 tablet (2 mg total) by mouth 2 (two) times daily.  . nitroGLYCERIN (NITROSTAT) 0.4 MG SL tablet Place 1 tablet (0.4 mg total)  under the tongue every 5 (five) minutes as needed for chest pain.  . promethazine (PHENERGAN) 25 MG tablet Take 25 mg by mouth every 6 (six) hours as needed for nausea or vomiting.  . traMADol (ULTRAM) 50 MG tablet Take by mouth every 6 (six) hours as needed for moderate pain.   . [DISCONTINUED] Oxycodone HCl 10 MG TABS Take 10 mg by mouth every 4 (four) hours as needed (pain).   No facility-administered encounter medications on file as of 03/11/2015.     Objective: Blood pressure 130/78, pulse 68, temperature 97.9 F (36.6 C), temperature source Oral, resp. rate 18, height 5\' 11"  (1.803 m), weight 191 lb 1.6 oz (86.682 kg). Patient is alert and in no acute distress. Conjunctiva is pink. Sclera is nonicteric Oropharyngeal mucosa is normal. No neck masses or thyromegaly noted. Cardiac exam with regular rhythm normal S1 and S2. No murmur or gallop noted. Lungs are clear to auscultation. Abdomen abdomen is full. Hernia noted in epigastric region involving upper part of midline scar. It is completely reducible and nontender. No organomegaly or masses. Xiphisternum is noted to be split into two with a prominent left part. It is not tender. No LE edema or clubbing noted.   Assessment:  #1. 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 Risk of obstruction or incarceration is low because of its location. Since he is  very concerned he may want to discuss a referral with Dr. Gerarda Fraction. #2. History of colonic adenomas. Last colonoscopy was in September 2015 and next one will be due in September 2018.   Plan:  Next colonoscopy would be in September 2018. Patient advised to support his upper abdominal wall when he is having a bowel movement or coughing spells. Surgical consultation to be arranged by Dr. Gerarda Fraction patients to do so. Office visit on as-needed basis.

## 2015-03-11 NOTE — Patient Instructions (Signed)
Please discuss with Dr. Gerarda Fraction about getting surgical consultation regarding ventral hernia. Colonoscopy in September 2018.

## 2015-03-13 DIAGNOSIS — F419 Anxiety disorder, unspecified: Secondary | ICD-10-CM | POA: Diagnosis not present

## 2015-03-13 DIAGNOSIS — Z6829 Body mass index (BMI) 29.0-29.9, adult: Secondary | ICD-10-CM | POA: Diagnosis not present

## 2015-03-13 DIAGNOSIS — Z1389 Encounter for screening for other disorder: Secondary | ICD-10-CM | POA: Diagnosis not present

## 2015-03-13 DIAGNOSIS — N400 Enlarged prostate without lower urinary tract symptoms: Secondary | ICD-10-CM | POA: Diagnosis not present

## 2015-03-13 DIAGNOSIS — I10 Essential (primary) hypertension: Secondary | ICD-10-CM | POA: Diagnosis not present

## 2015-03-21 ENCOUNTER — Ambulatory Visit: Payer: Medicare Other | Admitting: Cardiovascular Disease

## 2015-03-25 ENCOUNTER — Ambulatory Visit: Payer: Medicare Other | Admitting: Physician Assistant

## 2015-03-26 ENCOUNTER — Ambulatory Visit: Payer: Medicare Other | Admitting: Cardiology

## 2015-04-07 DIAGNOSIS — Z961 Presence of intraocular lens: Secondary | ICD-10-CM | POA: Diagnosis not present

## 2015-04-07 DIAGNOSIS — H40012 Open angle with borderline findings, low risk, left eye: Secondary | ICD-10-CM | POA: Diagnosis not present

## 2015-04-07 DIAGNOSIS — H26492 Other secondary cataract, left eye: Secondary | ICD-10-CM | POA: Diagnosis not present

## 2015-04-08 ENCOUNTER — Encounter: Payer: Self-pay | Admitting: Cardiology

## 2015-04-08 ENCOUNTER — Ambulatory Visit (INDEPENDENT_AMBULATORY_CARE_PROVIDER_SITE_OTHER): Payer: Medicare Other | Admitting: Cardiology

## 2015-04-08 VITALS — BP 122/64 | HR 62 | Ht 71.0 in | Wt 187.0 lb

## 2015-04-08 DIAGNOSIS — R079 Chest pain, unspecified: Secondary | ICD-10-CM

## 2015-04-08 DIAGNOSIS — Z951 Presence of aortocoronary bypass graft: Secondary | ICD-10-CM

## 2015-04-08 DIAGNOSIS — K219 Gastro-esophageal reflux disease without esophagitis: Secondary | ICD-10-CM

## 2015-04-08 DIAGNOSIS — F419 Anxiety disorder, unspecified: Secondary | ICD-10-CM | POA: Diagnosis not present

## 2015-04-08 DIAGNOSIS — F13231 Sedative, hypnotic or anxiolytic dependence with withdrawal delirium: Secondary | ICD-10-CM

## 2015-04-08 DIAGNOSIS — R55 Syncope and collapse: Secondary | ICD-10-CM

## 2015-04-08 DIAGNOSIS — F13931 Sedative, hypnotic or anxiolytic use, unspecified with withdrawal delirium: Secondary | ICD-10-CM

## 2015-04-08 NOTE — Assessment & Plan Note (Signed)
Saw Dr Laural Golden 03/11/15. He has a ventral incisional hernia in the epigastric region. Risk of obstruction or incarceration was felt to be low because of its location.

## 2015-04-08 NOTE — Progress Notes (Signed)
04/08/2015 Levin Bacon   07-03-1940  [id number]  Primary Physician Glo Herring., MD Primary Cardiologist: Dr Bronson Ing  HPI:  75 y/o male with a history of CABG x 5 in 2011. He has not had a functional study since but has done well from a cardiac standpoint. He saw Dr Nils Pyle in Sept 2016 for epigastric discomfort and was felt to possible have a fracture sternal wire. He also has an epigastric incisional hernia. The pt was told he could have the fractured wire repaired when he was ready. Unfortunatly the pt has significant anxiety issues. He was admitted through the ED shortly after seeing Dr Nils Pyle after he ran out of his Ativan and his prescrition could not be filled. He had apparently been taking extra Ativan. He went without Ativan for "5 days" then showed up in the ED with MS changes and a fall.  He is seen in the office today. I'm not sure why he is here. He is a rambling historian. He brought a CXR he had done for me to look at, he was afraid he may have broken a rib in September. He is concerned that his legs are falling asleep when he is on the commode. "My body is changing" He denies any symptoms that sound like angina.    Current Outpatient Prescriptions  Medication Sig Dispense Refill  . aspirin EC 81 MG tablet Take 81 mg by mouth daily.    Marland Kitchen atorvastatin (LIPITOR) 40 MG tablet Take 1 tablet (40 mg total) by mouth daily. 30 tablet 6  . buPROPion (WELLBUTRIN XL) 300 MG 24 hr tablet Take 300 mg by mouth daily.    Marland Kitchen HYDROcodone-acetaminophen (NORCO) 10-325 MG tablet TK 1 T PO Q 3 H PRN. MAX 6 PER DAY  0  . lisinopril (PRINIVIL,ZESTRIL) 5 MG tablet Take 5 mg by mouth daily.     Marland Kitchen LORazepam (ATIVAN) 2 MG tablet Take 1 tablet (2 mg total) by mouth 2 (two) times daily. 30 tablet 0  . nitroGLYCERIN (NITROSTAT) 0.4 MG SL tablet Place 1 tablet (0.4 mg total) under the tongue every 5 (five) minutes as needed for chest pain. 25 tablet 3  . promethazine (PHENERGAN) 25 MG  tablet Take 25 mg by mouth every 6 (six) hours as needed for nausea or vomiting.    . traMADol (ULTRAM) 50 MG tablet Take by mouth every 6 (six) hours as needed for moderate pain.      No current facility-administered medications for this visit.    Allergies  Allergen Reactions  . Claritin [Loratadine] Other (See Comments)    unknown  . Cymbalta [Duloxetine Hcl] Rash    Social History   Social History  . Marital Status: Single    Spouse Name: N/A  . Number of Children: 0  . Years of Education: N/A   Occupational History  .     Social History Main Topics  . Smoking status: Former Smoker -- 0.50 packs/day for 40 years    Types: Cigarettes    Start date: 05/29/1961    Quit date: 03/14/2005  . Smokeless tobacco: Never Used     Comment: Patient states that he smoked 10 cigarettes a day  . Alcohol Use: No  . Drug Use: No  . Sexual Activity: Yes    Birth Control/ Protection: None   Other Topics Concern  . Not on file   Social History Narrative     Review of Systems: General: negative for chills, fever, night  sweats or weight changes.  Cardiovascular: negative for chest pain, dyspnea on exertion, edema, orthopnea, palpitations, paroxysmal nocturnal dyspnea or shortness of breath Dermatological: negative for rash Respiratory: negative for cough or wheezing Urologic: negative for hematuria Abdominal: negative for nausea, vomiting, diarrhea, bright red blood per rectum, melena, or hematemesis Neurologic: negative for visual changes, syncope, or dizziness All other systems reviewed and are otherwise negative except as noted above.    Blood pressure 122/64, pulse 62, height 5\' 11"  (1.803 m), weight 187 lb (84.823 kg), SpO2 95 %.  General appearance: alert, cooperative, no distress and mildly obese Neck: no carotid bruit and no JVD Lungs: clear to auscultation bilaterally Heart: regular rate and rhythm Extremities: no edema, he has valgus deformity Lt foot Pulses: 2+ and  symmetric Skin: Skin color, texture, turgor normal. No rashes or lesions Neurologic: Grossly normal  EKG NSR without acute changes  ASSESSMENT AND PLAN:   S/P CABG x 5  2011-Dr. Gerhardt LIMA to LAD, SVG to diagonal, sequential SVG to OM1 and OM 2 and SVG to distal right coronary Seen by Dr Nils Pyle Sept 2016 for possible fractured sternal wire.   Benzodiazepine withdrawal Sept 2016  GERD Saw Dr Laural Golden 03/11/15. He has a ventral incisional hernia in the epigastric region. Risk of obstruction or incarceration was felt to be low because of its location.    PLAN  I reassured Mr Warwick that he did not break any ribs (he had negative rib films in Sept) and that his EKG looked OK. I told him he had good circulation in his low extremities on exam. I suggested he f/u with Dr Nils Pyle about his sternal wire and we'll help him make that appointment. I will review with Dr Bronson Ing if he needs any pre op testing. He may need foot surgery in the future as well for valgus deformity Lt foot  Deborra Phegley K PA-C 04/08/2015 1:52 PM

## 2015-04-08 NOTE — Assessment & Plan Note (Signed)
Sept 2016

## 2015-04-08 NOTE — Patient Instructions (Signed)
Your physician recommends that you schedule a follow-up appointment with Dr. Jacklynn Ganong   Your physician recommends that you continue on your current medications as directed. Please refer to the Current Medication list given to you today.  Thank you for choosing [facility]!

## 2015-04-08 NOTE — Assessment & Plan Note (Signed)
2011-DrServando Snare LIMA to LAD, SVG to diagonal, sequential SVG to OM1 and OM 2 and SVG to distal right coronary Seen by Dr Nils Pyle Sept 2016 for possible fractured sternal wire.

## 2015-04-14 ENCOUNTER — Other Ambulatory Visit: Payer: Self-pay | Admitting: Cardiothoracic Surgery

## 2015-04-14 DIAGNOSIS — Z951 Presence of aortocoronary bypass graft: Secondary | ICD-10-CM

## 2015-04-16 ENCOUNTER — Ambulatory Visit (INDEPENDENT_AMBULATORY_CARE_PROVIDER_SITE_OTHER): Payer: Medicare Other | Admitting: Cardiothoracic Surgery

## 2015-04-16 ENCOUNTER — Encounter: Payer: Self-pay | Admitting: Cardiothoracic Surgery

## 2015-04-16 ENCOUNTER — Ambulatory Visit
Admission: RE | Admit: 2015-04-16 | Discharge: 2015-04-16 | Disposition: A | Discharge status: Home / Self Care | Payer: Medicare Other | Source: Ambulatory Visit | Attending: Cardiothoracic Surgery | Admitting: Cardiothoracic Surgery

## 2015-04-16 VITALS — BP 127/77 | HR 74 | Resp 20 | Ht 71.0 in | Wt 187.0 lb

## 2015-04-16 DIAGNOSIS — Z951 Presence of aortocoronary bypass graft: Secondary | ICD-10-CM | POA: Diagnosis not present

## 2015-04-16 DIAGNOSIS — R0789 Other chest pain: Secondary | ICD-10-CM

## 2015-04-16 DIAGNOSIS — R079 Chest pain, unspecified: Secondary | ICD-10-CM | POA: Diagnosis not present

## 2015-04-16 DIAGNOSIS — R918 Other nonspecific abnormal finding of lung field: Secondary | ICD-10-CM | POA: Diagnosis not present

## 2015-04-17 NOTE — Progress Notes (Signed)
PCP is Glo Herring., MD Referring Provider is Erlene Quan, PA-C  Chief Complaint  Patient presents with  . Chest Pain    HX of CABG 2011, c/o chest pain, possible sternal wire/ CXR prior     HPI:the patient returns to discuss atypical lower sternal pain. He is several years status post multivessel CABG. I've seen in the past for this and a CT scan shows a broken but nondisplaced lower sternal wire. There is a mild deformity of the xiphoid which is slightly upturned. There is a small one finger abdominal wall hernia also noted on chest CT scan which has not changed on exam. The patient's complaints do not seem significant. Although I told the patient previously saw him several months ago that I would consider removing the broken wire and trimming the xiphoid process, this point I do not think general anesthesia and the surgical procedure is indicated and I recommended conservative watch and follow approach. The patient has severe problems with his of feet and walking and has an upcoming foot surgery which is much more important. I'll see the patient back later this year.   Past Medical History  Diagnosis Date  . Hypertension   . CAD (coronary artery disease)   . Insomnia   . Depression   . Colon polyps   . GERD (gastroesophageal reflux disease)   . Dyslipidemia   . Myocardial infarction ([place]) 2011  . Arthritis   . Ruptured lumbar disc     Past Surgical History  Procedure Laterality Date  . Colonoscopy 10/10/2001 for surveillqance for colonic polyps.      Biopsy revealed hyperplastic polyps. Next colonoscopy in 3 yrs.  Marland Kitchen Appendectomy    . Colonoscopy  03/24/2011    Procedure: COLONOSCOPY;  Surgeon: Rogene Houston, MD;  [facility];  Service: Endoscopy;  Laterality: N/A;  9:30  . Cardiac surgery    . Cardiac catheterization  04/17/2009    referred for CABG  . Coronary artery bypass graft  2011    x5   . Hernia repair      left inguinal- Dr Romona Curls  . Cataract  extraction w/phaco Right 08/13/2013    Procedure: CATARACT EXTRACTION PHACO AND INTRAOCULAR LENS PLACEMENT (IOC);  Surgeon: Tonny Branch, MD;  Location: [facility];  Service: Ophthalmology;  Laterality: Right;  CDE 7.11  . Cataract extraction w/phaco Left 09/06/2013    Procedure: CATARACT EXTRACTION PHACO AND INTRAOCULAR LENS PLACEMENT (IOC);  Surgeon: Tonny Branch, MD;  Location: [facility];  Service: Ophthalmology;  Laterality: Left;  CDE: 9.87  . Colonoscopy N/A 12/20/2013    Procedure: COLONOSCOPY;  Surgeon: Rogene Houston, MD;  [facility];  Service: Endoscopy;  Laterality: N/A;  930    Family History  Problem Relation Age of Onset  . Colon cancer Neg Hx     Social History Social History  Substance Use Topics  . Smoking status: Former Smoker -- 0.50 packs/day for 40 years    Types: Cigarettes    Start date: 05/29/1961    Quit date: 03/14/2005  . Smokeless tobacco: Never Used     Comment: Patient states that he smoked 10 cigarettes a day  . Alcohol Use: No    Current Outpatient Prescriptions  Medication Sig Dispense Refill  . aspirin EC 81 MG tablet Take 81 mg by mouth daily.    Marland Kitchen atorvastatin (LIPITOR) 40 MG tablet Take 1 tablet (40 mg total) by mouth daily. 30 tablet 6  . buPROPion [facility]  XL) 300 MG 24 hr tablet Take 300 mg by mouth daily.    Marland Kitchen HYDROcodone-acetaminophen (NORCO) 10-325 MG tablet TK 1 T PO Q 3 H PRN. MAX 6 PER DAY  0  . lisinopril (PRINIVIL,ZESTRIL) 5 MG tablet Take 5 mg by mouth daily.     Marland Kitchen LORazepam (ATIVAN) 2 MG tablet Take 1 tablet (2 mg total) by mouth 2 (two) times daily. 30 tablet 0  . nitroGLYCERIN (NITROSTAT) 0.4 MG SL tablet Place 1 tablet (0.4 mg total) under the tongue every 5 (five) minutes as needed for chest pain. 25 tablet 3  . promethazine (PHENERGAN) 25 MG tablet Take 25 mg by mouth every 6 (six) hours as needed for nausea or vomiting.    . traMADol (ULTRAM) 50 MG tablet Take by mouth every 6 (six) hours as needed for moderate pain.       No current facility-administered medications for this visit.    Allergies  Allergen Reactions  . Claritin [Loratadine] Other (See Comments)    unknown  . Cymbalta [Duloxetine Hcl] Rash    Review of Systems  No symptoms of heart disease No drainage or redness of surgical incision  BP 127/77 mmHg  Pulse 74  Resp 20  Ht 5\' 11"  (1.803 m)  Wt 187 lb (84.823 kg)  BMI 26.09 kg/m2  SpO2 97% Physical Exam Alert with multiple complaints but no distress Heart rate regular No cardiac murmur Sternum stable no click, no tenderness over the lower sternum Prominent xiphoid process  Diagnostic Tests: Previous CT scans personally reviewed  Impression: Broken sternal wire No evidence of malunion of the sternal closure Surgery not indicated at this time  Plan:we'll follow the patient back later this year to see how he feels.   Len Childs, MD [facility] [phone]

## 2015-04-23 DIAGNOSIS — M79672 Pain in left foot: Secondary | ICD-10-CM | POA: Diagnosis not present

## 2015-04-23 DIAGNOSIS — M25572 Pain in left ankle and joints of left foot: Secondary | ICD-10-CM | POA: Diagnosis not present

## 2015-05-07 DIAGNOSIS — M25572 Pain in left ankle and joints of left foot: Secondary | ICD-10-CM | POA: Diagnosis not present

## 2015-05-07 DIAGNOSIS — M2012 Hallux valgus (acquired), left foot: Secondary | ICD-10-CM | POA: Diagnosis not present

## 2015-05-07 DIAGNOSIS — Z79899 Other long term (current) drug therapy: Secondary | ICD-10-CM | POA: Diagnosis not present

## 2015-05-12 DIAGNOSIS — Z1389 Encounter for screening for other disorder: Secondary | ICD-10-CM | POA: Diagnosis not present

## 2015-05-12 DIAGNOSIS — F419 Anxiety disorder, unspecified: Secondary | ICD-10-CM | POA: Diagnosis not present

## 2015-05-12 DIAGNOSIS — G894 Chronic pain syndrome: Secondary | ICD-10-CM | POA: Diagnosis not present

## 2015-05-12 DIAGNOSIS — R109 Unspecified abdominal pain: Secondary | ICD-10-CM | POA: Diagnosis not present

## 2015-05-12 DIAGNOSIS — K219 Gastro-esophageal reflux disease without esophagitis: Secondary | ICD-10-CM | POA: Diagnosis not present

## 2015-05-12 DIAGNOSIS — Z6828 Body mass index (BMI) 28.0-28.9, adult: Secondary | ICD-10-CM | POA: Diagnosis not present

## 2015-05-26 DIAGNOSIS — R3129 Other microscopic hematuria: Secondary | ICD-10-CM | POA: Diagnosis not present

## 2015-05-26 DIAGNOSIS — N3941 Urge incontinence: Secondary | ICD-10-CM | POA: Diagnosis not present

## 2015-05-26 DIAGNOSIS — R3915 Urgency of urination: Secondary | ICD-10-CM | POA: Diagnosis not present

## 2015-06-02 DIAGNOSIS — R3129 Other microscopic hematuria: Secondary | ICD-10-CM | POA: Diagnosis not present

## 2015-06-02 DIAGNOSIS — R918 Other nonspecific abnormal finding of lung field: Secondary | ICD-10-CM | POA: Diagnosis not present

## 2015-06-09 ENCOUNTER — Encounter: Payer: Self-pay | Admitting: Cardiovascular Disease

## 2015-06-09 ENCOUNTER — Ambulatory Visit (INDEPENDENT_AMBULATORY_CARE_PROVIDER_SITE_OTHER): Payer: Medicare Other | Admitting: Cardiovascular Disease

## 2015-06-09 VITALS — BP 112/70 | HR 77 | Ht 71.0 in | Wt 186.0 lb

## 2015-06-09 DIAGNOSIS — E782 Mixed hyperlipidemia: Secondary | ICD-10-CM

## 2015-06-09 DIAGNOSIS — Z951 Presence of aortocoronary bypass graft: Secondary | ICD-10-CM | POA: Diagnosis not present

## 2015-06-09 DIAGNOSIS — I10 Essential (primary) hypertension: Secondary | ICD-10-CM

## 2015-06-09 DIAGNOSIS — I25812 Atherosclerosis of bypass graft of coronary artery of transplanted heart without angina pectoris: Secondary | ICD-10-CM | POA: Diagnosis not present

## 2015-06-09 NOTE — Patient Instructions (Signed)
Your physician wants you to follow-up in: 1 year You will receive a reminder letter in the mail two months in advance. If you don't receive a letter, please call our office to schedule the follow-up appointment.   Your physician recommends that you continue on your current medications as directed. Please refer to the Current Medication list given to you today.    If you need a refill on your cardiac medications before your next appointment, please call your pharmacy.      Thank you for choosing [facility] !        

## 2015-06-09 NOTE — Progress Notes (Signed)
Patient ID: Rodney Decker, male   DOB: 1940/09/03, 75 y.o.   MRN: [id number]      SUBJECTIVE: Mr. Longden presents for routine follow-up. 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 He asks for frequent testing due to his anxiety.  He has a broken sternal wire which is nondisplaced and sees CT surgery.  He proceeded to tell me about his left foot hammertoe and then asked questions why there are veins in his legs.   Review of Systems: As per "subjective", otherwise negative.  Allergies  Allergen Reactions  . Claritin [Loratadine] Other (See Comments)    unknown  . Cymbalta [Duloxetine Hcl] Rash    Current Outpatient Prescriptions  Medication Sig Dispense Refill  . aspirin EC 81 MG tablet Take 81 mg by mouth daily.    Marland Kitchen atorvastatin (LIPITOR) 40 MG tablet Take 1 tablet (40 mg total) by mouth daily. 30 tablet 6  . buPROPion (WELLBUTRIN XL) 300 MG 24 hr tablet Take 300 mg by mouth daily.    Marland Kitchen HYDROcodone-acetaminophen (NORCO) 10-325 MG tablet TK 1 T PO Q 3 H PRN. MAX 6 PER DAY  0  . lisinopril (PRINIVIL,ZESTRIL) 5 MG tablet Take 5 mg by mouth daily.     Marland Kitchen LORazepam (ATIVAN) 2 MG tablet Take 1 tablet (2 mg total) by mouth 2 (two) times daily. 30 tablet 0  . nitroGLYCERIN (NITROSTAT) 0.4 MG SL tablet Place 1 tablet (0.4 mg total) under the tongue every 5 (five) minutes as needed for chest pain. 25 tablet 3  . promethazine (PHENERGAN) 25 MG tablet Take 25 mg by mouth every 6 (six) hours as needed for nausea or vomiting.    . traMADol (ULTRAM) 50 MG tablet Take by mouth every 6 (six) hours as needed for moderate pain.      No current facility-administered medications for this visit.    Past Medical History  Diagnosis Date  . Hypertension   . CAD (coronary artery disease)   . Insomnia   . Depression   . Colon polyps   . GERD (gastroesophageal reflux  disease)   . Dyslipidemia   . Myocardial infarction ([place]) 2011  . Arthritis   . Ruptured lumbar disc     Past Surgical History  Procedure Laterality Date  . Colonoscopy 10/10/2001 for surveillqance for colonic polyps.      Biopsy revealed hyperplastic polyps. Next colonoscopy in 3 yrs.  Marland Kitchen Appendectomy    . Colonoscopy  03/24/2011    Procedure: COLONOSCOPY;  Surgeon: Rogene Houston, MD;  [facility];  Service: Endoscopy;  Laterality: N/A;  9:30  . Cardiac surgery    . Cardiac catheterization  04/17/2009    referred for CABG  . Coronary artery bypass graft  2011    x5   . Hernia repair      left inguinal- Dr Romona Curls  . Cataract extraction w/phaco Right 08/13/2013    Procedure: CATARACT EXTRACTION PHACO AND INTRAOCULAR LENS PLACEMENT (IOC);  Surgeon: Tonny Branch, MD;  Location: [facility];  Service: Ophthalmology;  Laterality: Right;  CDE 7.11  . Cataract extraction w/phaco Left 09/06/2013    Procedure: CATARACT EXTRACTION PHACO AND INTRAOCULAR LENS PLACEMENT (IOC);  Surgeon: Tonny Branch, MD;  Location: [facility];  Service: Ophthalmology;  Laterality: Left;  CDE: 9.87  . Colonoscopy N/A 12/20/2013    Procedure: COLONOSCOPY;  Surgeon: Mechele Dawley  Laural Golden, MD;  [facility];  Service: Endoscopy;  Laterality: N/A;  930    Social History   Social History  . Marital Status: Single    Spouse Name: N/A  . Number of Children: 0  . Years of Education: N/A   Occupational History  .     Social History Main Topics  . Smoking status: Former Smoker -- 0.50 packs/day for 40 years    Types: Cigarettes    Start date: 05/29/1961    Quit date: 03/14/2005  . Smokeless tobacco: Never Used     Comment: Patient states that he smoked 10 cigarettes a day  . Alcohol Use: No  . Drug Use: No  . Sexual Activity: Yes    Birth Control/ Protection: None   Other Topics Concern  . Not on file   Social History Narrative     Filed Vitals:   06/09/15 1146  BP: 112/70  Pulse: 77  Height:  5\' 11"  (1.803 m)  Weight: 186 lb (84.369 kg)  SpO2: 94%    PHYSICAL EXAM General: NAD HEENT: Normal. Neck: No JVD, no thyromegaly. Lungs: Clear to auscultation bilaterally with normal respiratory effort. CV: Nondisplaced PMI.  Regular rate and rhythm, normal S1/S2, no S3/S4, no murmur. No pretibial or periankle edema.  No carotid bruit.   Abdomen: Soft, nontender, no distention.  Neurologic: Alert and oriented.  Psych: Normal affect. Skin: Normal. Musculoskeletal: No gross deformities.  ECG: Most recent ECG reviewed.      ASSESSMENT AND PLAN: 1. CAD with history of CABG: Symptomatically stable. Continue ASA 81 mg daily along with Lipitor 40 mg.   2. Essential HTN: Well controlled on lisinopril 5 mg daily. No changes.  3. Hyperlipidemia: Will obtain copy of lipids from PCP. Continue Lipitor 40 mg.  Dispo: f/u 1 year.  Kate Sable, M.D., F.A.C.C.

## 2015-06-26 DIAGNOSIS — Z1389 Encounter for screening for other disorder: Secondary | ICD-10-CM | POA: Diagnosis not present

## 2015-06-26 DIAGNOSIS — Z6829 Body mass index (BMI) 29.0-29.9, adult: Secondary | ICD-10-CM | POA: Diagnosis not present

## 2015-06-26 DIAGNOSIS — E782 Mixed hyperlipidemia: Secondary | ICD-10-CM | POA: Diagnosis not present

## 2015-06-26 DIAGNOSIS — I10 Essential (primary) hypertension: Secondary | ICD-10-CM | POA: Diagnosis not present

## 2015-06-26 DIAGNOSIS — F419 Anxiety disorder, unspecified: Secondary | ICD-10-CM | POA: Diagnosis not present

## 2015-07-01 ENCOUNTER — Ambulatory Visit: Payer: Self-pay | Admitting: "Endocrinology

## 2015-07-10 DIAGNOSIS — Z1389 Encounter for screening for other disorder: Secondary | ICD-10-CM | POA: Diagnosis not present

## 2015-07-10 DIAGNOSIS — Z6830 Body mass index (BMI) 30.0-30.9, adult: Secondary | ICD-10-CM | POA: Diagnosis not present

## 2015-07-10 DIAGNOSIS — Z0000 Encounter for general adult medical examination without abnormal findings: Secondary | ICD-10-CM | POA: Diagnosis not present

## 2015-07-18 ENCOUNTER — Other Ambulatory Visit (HOSPITAL_COMMUNITY): Payer: Self-pay | Admitting: Internal Medicine

## 2015-07-18 DIAGNOSIS — R1310 Dysphagia, unspecified: Secondary | ICD-10-CM

## 2015-07-25 ENCOUNTER — Ambulatory Visit (HOSPITAL_COMMUNITY)
Admission: RE | Admit: 2015-07-25 | Discharge: 2015-07-25 | Disposition: A | Discharge status: Home / Self Care | Payer: Medicare Other | Source: Ambulatory Visit | Attending: Internal Medicine | Admitting: Internal Medicine

## 2015-07-25 DIAGNOSIS — K222 Esophageal obstruction: Secondary | ICD-10-CM | POA: Insufficient documentation

## 2015-07-25 DIAGNOSIS — T17300A Unspecified foreign body in larynx causing asphyxiation, initial encounter: Secondary | ICD-10-CM | POA: Diagnosis not present

## 2015-07-25 DIAGNOSIS — R1310 Dysphagia, unspecified: Secondary | ICD-10-CM | POA: Insufficient documentation

## 2015-08-04 DIAGNOSIS — E782 Mixed hyperlipidemia: Secondary | ICD-10-CM | POA: Diagnosis not present

## 2015-08-04 DIAGNOSIS — Z1389 Encounter for screening for other disorder: Secondary | ICD-10-CM | POA: Diagnosis not present

## 2015-08-04 DIAGNOSIS — M1991 Primary osteoarthritis, unspecified site: Secondary | ICD-10-CM | POA: Diagnosis not present

## 2015-08-04 DIAGNOSIS — Z6828 Body mass index (BMI) 28.0-28.9, adult: Secondary | ICD-10-CM | POA: Diagnosis not present

## 2015-08-04 DIAGNOSIS — G894 Chronic pain syndrome: Secondary | ICD-10-CM | POA: Diagnosis not present

## 2015-08-04 DIAGNOSIS — F419 Anxiety disorder, unspecified: Secondary | ICD-10-CM | POA: Diagnosis not present

## 2015-08-04 DIAGNOSIS — E210 Primary hyperparathyroidism: Secondary | ICD-10-CM | POA: Diagnosis not present

## 2015-08-04 DIAGNOSIS — E663 Overweight: Secondary | ICD-10-CM | POA: Diagnosis not present

## 2015-08-14 ENCOUNTER — Encounter (INDEPENDENT_AMBULATORY_CARE_PROVIDER_SITE_OTHER): Payer: Self-pay | Admitting: *Deleted

## 2015-08-18 DIAGNOSIS — E213 Hyperparathyroidism, unspecified: Secondary | ICD-10-CM | POA: Diagnosis not present

## 2015-08-18 DIAGNOSIS — Z6829 Body mass index (BMI) 29.0-29.9, adult: Secondary | ICD-10-CM | POA: Diagnosis not present

## 2015-08-18 DIAGNOSIS — E663 Overweight: Secondary | ICD-10-CM | POA: Diagnosis not present

## 2015-08-27 ENCOUNTER — Encounter (INDEPENDENT_AMBULATORY_CARE_PROVIDER_SITE_OTHER): Payer: Self-pay | Admitting: Internal Medicine

## 2015-08-27 ENCOUNTER — Encounter (INDEPENDENT_AMBULATORY_CARE_PROVIDER_SITE_OTHER): Payer: Self-pay | Admitting: *Deleted

## 2015-08-27 ENCOUNTER — Ambulatory Visit (INDEPENDENT_AMBULATORY_CARE_PROVIDER_SITE_OTHER): Payer: Medicare Other | Admitting: Internal Medicine

## 2015-08-27 ENCOUNTER — Other Ambulatory Visit (INDEPENDENT_AMBULATORY_CARE_PROVIDER_SITE_OTHER): Payer: Self-pay | Admitting: Internal Medicine

## 2015-08-27 VITALS — BP 120/82 | HR 72 | Temp 98.0°F | Ht 71.0 in | Wt 189.8 lb

## 2015-08-27 DIAGNOSIS — R1319 Other dysphagia: Secondary | ICD-10-CM

## 2015-08-27 DIAGNOSIS — I25812 Atherosclerosis of bypass graft of coronary artery of transplanted heart without angina pectoris: Secondary | ICD-10-CM | POA: Diagnosis not present

## 2015-08-27 DIAGNOSIS — R1310 Dysphagia, unspecified: Secondary | ICD-10-CM

## 2015-08-27 NOTE — Progress Notes (Signed)
Subjective:    Patient ID: Rodney Decker, male    DOB: Aug 20, 1940, 75 y.o.   MRN: [id number]  HPI Referred by Dr. Gerarda Fraction for dysphagia. He tells me foods feel like they are lodging.  Symptoms for a year.  Denies prior hx of dysphagia.  His appetite is good. No weight loss. Usually has a BM x 1. Sometimes he may not have one for 3 days. He denies any acid reflux.  No melena or BRRB.Marland Kitchen History of colonic adenomas. Last colonoscopy was in September 2015 and next one will be due in September 2018   07/25/2015 Esophagram: Dysphagia:  IMPRESSION: Distal esophageal stricture at or just above the gastroesophageal junction obstructing a 12.5 mm diameter barium tablet.  Swallowing dysfunction with laryngeal penetration and silent aspiration of thin barium contrast material into the proximal trachea.  Vallecular and piriform sinus residuals.    Review of Systems Past Medical History  Diagnosis Date  . Hypertension   . CAD (coronary artery disease)   . Insomnia   . Depression   . Colon polyps   . GERD (gastroesophageal reflux disease)   . Dyslipidemia   . Myocardial infarction ([place]) 2011  . Arthritis   . Ruptured lumbar disc     Past Surgical History  Procedure Laterality Date  . Colonoscopy 10/10/2001 for surveillqance for colonic polyps.      Biopsy revealed hyperplastic polyps. Next colonoscopy in 3 yrs.  Marland Kitchen Appendectomy    . Colonoscopy  03/24/2011    Procedure: COLONOSCOPY;  Surgeon: Rogene Houston, MD;  [facility];  Service: Endoscopy;  Laterality: N/A;  9:30  . Cardiac surgery    . Cardiac catheterization  04/17/2009    referred for CABG  . Coronary artery bypass graft  2011    x5   . Hernia repair      left inguinal- Dr Romona Curls  . Cataract extraction w/phaco Right 08/13/2013    Procedure: CATARACT EXTRACTION PHACO AND INTRAOCULAR LENS PLACEMENT (IOC);  Surgeon: Tonny Branch, MD;  Location: [facility];  Service: Ophthalmology;  Laterality: Right;  CDE  7.11  . Cataract extraction w/phaco Left 09/06/2013    Procedure: CATARACT EXTRACTION PHACO AND INTRAOCULAR LENS PLACEMENT (IOC);  Surgeon: Tonny Branch, MD;  Location: [facility];  Service: Ophthalmology;  Laterality: Left;  CDE: 9.87  . Colonoscopy N/A 12/20/2013    Procedure: COLONOSCOPY;  Surgeon: Rogene Houston, MD;  [facility];  Service: Endoscopy;  Laterality: N/A;  930    Allergies  Allergen Reactions  . Claritin [Loratadine] Other (See Comments)    unknown  . Cymbalta [Duloxetine Hcl] Rash    Current Outpatient Prescriptions on File Prior to Visit  Medication Sig Dispense Refill  . aspirin EC 81 MG tablet Take 81 mg by mouth daily.    Marland Kitchen atorvastatin (LIPITOR) 40 MG tablet Take 1 tablet (40 mg total) by mouth daily. 30 tablet 6  . buPROPion (WELLBUTRIN XL) 300 MG 24 hr tablet Take 300 mg by mouth daily.    Marland Kitchen HYDROcodone-acetaminophen (NORCO) 10-325 MG tablet TK 1 T PO Q 3 H PRN. MAX 6 PER DAY  0  . lisinopril (PRINIVIL,ZESTRIL) 5 MG tablet Take 5 mg by mouth daily.     Marland Kitchen LORazepam (ATIVAN) 2 MG tablet Take 1 tablet (2 mg total) by mouth 2 (two) times daily. 30 tablet 0  . nitroGLYCERIN (NITROSTAT) 0.4 MG SL tablet Place 1 tablet (0.4 mg total) under the tongue every 5 (five) minutes  as needed for chest pain. 25 tablet 3  . promethazine (PHENERGAN) 25 MG tablet Take 25 mg by mouth every 6 (six) hours as needed for nausea or vomiting.    . traMADol (ULTRAM) 50 MG tablet Take by mouth every 6 (six) hours as needed for moderate pain.      No current facility-administered medications on file prior to visit.        Objective:   Physical Exam Blood pressure 120/82, pulse 72, temperature 98 F (36.7 C), height 5\' 11"  (1.803 m), weight 189 lb 12.8 oz (86.093 kg). Alert and oriented. Skin warm and dry. Oral mucosa is moist.   . Sclera anicteric, conjunctivae is pink. Thyroid not enlarged. No cervical lymphadenopathy. Lungs clear. Heart regular rate and rhythm.  Abdomen is soft.  Bowel sounds are positive. No hepatomegaly. No abdominal masses felt. No tenderness.  No edema to lower extremities.          Assessment & Plan:  Dysphagia: The risks and benefits such as perforation, bleeding, and infection were reviewed with the patient and is agreeable.esophagram revealed a stricture.   EGD/ED.

## 2015-08-27 NOTE — Patient Instructions (Signed)
The risks and benefits such as perforation, bleeding, and infection were reviewed with the patient and is agreeable. 

## 2015-09-17 ENCOUNTER — Ambulatory Visit (INDEPENDENT_AMBULATORY_CARE_PROVIDER_SITE_OTHER): Payer: Medicare Other | Admitting: Cardiothoracic Surgery

## 2015-09-17 ENCOUNTER — Encounter: Payer: Self-pay | Admitting: Cardiothoracic Surgery

## 2015-09-17 VITALS — BP 142/75 | HR 71 | Resp 16 | Ht 71.0 in | Wt 189.0 lb

## 2015-09-17 DIAGNOSIS — R079 Chest pain, unspecified: Secondary | ICD-10-CM

## 2015-09-17 DIAGNOSIS — R0789 Other chest pain: Secondary | ICD-10-CM

## 2015-09-17 DIAGNOSIS — Z951 Presence of aortocoronary bypass graft: Secondary | ICD-10-CM | POA: Diagnosis not present

## 2015-09-17 DIAGNOSIS — I25812 Atherosclerosis of bypass graft of coronary artery of transplanted heart without angina pectoris: Secondary | ICD-10-CM | POA: Diagnosis not present

## 2015-09-17 DIAGNOSIS — T84498D Other mechanical complication of other internal orthopedic devices, implants and grafts, subsequent encounter: Secondary | ICD-10-CM

## 2015-09-17 DIAGNOSIS — T84218D Breakdown (mechanical) of internal fixation device of other bones, subsequent encounter: Secondary | ICD-10-CM

## 2015-09-17 NOTE — Progress Notes (Signed)
PCP is Glo Herring., MD Referring Provider is Erlene Quan, PA-C  Chief Complaint  Patient presents with  . Follow-up    broken sternal wire  Patient examined, PA and lateral of chest performed today personally reviewed and counseled with patient  HPI: 75 year old male returns for scheduled annual visit to monitor his chronic sternal pain and nondisplaced lower sternal wire fracture and small epigastric hernia. Patient underwent multivessel CABG in 2011. The patient was last hospitalized in the fall after he fell over a couch and was hospitalized for 2 days without significant thoracic injury. He has been recently seen by his cardiologist . He has no recurrent angina. He has no complaints about the sternal wire sternal pain. His main complaints are regarding his feet and hammertoe especially on the left foot.  Past Medical History  Diagnosis Date  . Hypertension   . CAD (coronary artery disease)   . Insomnia   . Depression   . Colon polyps   . GERD (gastroesophageal reflux disease)   . Dyslipidemia   . Myocardial infarction ([place]) 2011  . Arthritis   . Ruptured lumbar disc     Past Surgical History  Procedure Laterality Date  . Colonoscopy 10/10/2001 for surveillqance for colonic polyps.      Biopsy revealed hyperplastic polyps. Next colonoscopy in 3 yrs.  Marland Kitchen Appendectomy    . Colonoscopy  03/24/2011    Procedure: COLONOSCOPY;  Surgeon: Rogene Houston, MD;  [facility];  Service: Endoscopy;  Laterality: N/A;  9:30  . Cardiac surgery    . Cardiac catheterization  04/17/2009    referred for CABG  . Coronary artery bypass graft  2011    x5   . Hernia repair      left inguinal- Dr Romona Curls  . Cataract extraction w/phaco Right 08/13/2013    Procedure: CATARACT EXTRACTION PHACO AND INTRAOCULAR LENS PLACEMENT (IOC);  Surgeon: Tonny Branch, MD;  Location: [facility];  Service: Ophthalmology;  Laterality: Right;  CDE 7.11  . Cataract extraction w/phaco Left 09/06/2013     Procedure: CATARACT EXTRACTION PHACO AND INTRAOCULAR LENS PLACEMENT (IOC);  Surgeon: Tonny Branch, MD;  Location: [facility];  Service: Ophthalmology;  Laterality: Left;  CDE: 9.87  . Colonoscopy N/A 12/20/2013    Procedure: COLONOSCOPY;  Surgeon: Rogene Houston, MD;  [facility];  Service: Endoscopy;  Laterality: N/A;  930    Family History  Problem Relation Age of Onset  . Colon cancer Neg Hx     Social History Social History  Substance Use Topics  . Smoking status: Former Smoker -- 0.50 packs/day for 40 years    Types: Cigarettes    Start date: 05/29/1961    Quit date: 03/14/2005  . Smokeless tobacco: Never Used     Comment: Patient states that he smoked 10 cigarettes a day  . Alcohol Use: No    Current Outpatient Prescriptions  Medication Sig Dispense Refill  . aspirin EC 81 MG tablet Take 81 mg by mouth daily.    Marland Kitchen atorvastatin (LIPITOR) 40 MG tablet Take 1 tablet (40 mg total) by mouth daily. 30 tablet 6  . buPROPion (WELLBUTRIN XL) 300 MG 24 hr tablet Take 300 mg by mouth daily.    Marland Kitchen HYDROcodone-acetaminophen (NORCO) 10-325 MG tablet TK 1 T PO Q 3 H PRN. MAX 6 PER DAY  0  . lisinopril (PRINIVIL,ZESTRIL) 5 MG tablet Take 5 mg by mouth daily.     Marland Kitchen LORazepam (ATIVAN) 2 MG tablet  Take 1 tablet (2 mg total) by mouth 2 (two) times daily. 30 tablet 0  . nitroGLYCERIN (NITROSTAT) 0.4 MG SL tablet Place 1 tablet (0.4 mg total) under the tongue every 5 (five) minutes as needed for chest pain. 25 tablet 3  . promethazine (PHENERGAN) 25 MG tablet Take 25 mg by mouth every 6 (six) hours as needed for nausea or vomiting.    . traMADol (ULTRAM) 50 MG tablet Take by mouth every 6 (six) hours as needed for moderate pain.     . tamsulosin (FLOMAX) 0.4 MG CAPS capsule Take 0.4 mg by mouth.     No current facility-administered medications for this visit.    Allergies  Allergen Reactions  . Claritin [Loratadine] Other (See Comments)    unknown  . Cymbalta [Duloxetine Hcl] Rash     Review of Systems  No cardiac complaints\no shortness of breath  BP 142/75 mmHg  Pulse 71  Resp 16  Ht 5\' 11"  (1.803 m)  Wt 189 lb (85.73 kg)  BMI 26.37 kg/m2  SpO2 98% Physical Exam      Exam    General- alert and comfortable with multiple complaints   Lungs- clear without rales, wheezes   Cor- regular rate and rhythm, no murmur , gallop Sternal incision without pain tenderness or instability Small 1-2 finger epigastric defect/hernia without tenderness or visceral hernia   Abdomen- soft, non-tender   Extremities - warm, non-tender, minimal edema   Neuro- oriented, appropriate, no focal weakness   Diagnostic Tests: Chest x-ray clear, lower  sternal wire fracture remains nondisplaced  Impression: Chronic sternal wire fracture-asymptomatic Small epigastric hernia-asymptomatic  Plan: Patient to return as needed   Len Childs, MD [facility] [phone]

## 2015-09-19 ENCOUNTER — Ambulatory Visit (HOSPITAL_COMMUNITY)
Admission: RE | Admit: 2015-09-19 | Discharge: 2015-09-19 | Disposition: A | Discharge status: Home / Self Care | Payer: Medicare Other | Source: Ambulatory Visit | Attending: Internal Medicine | Admitting: Internal Medicine

## 2015-09-19 ENCOUNTER — Encounter (HOSPITAL_COMMUNITY): Payer: Self-pay | Admitting: *Deleted

## 2015-09-19 ENCOUNTER — Encounter (HOSPITAL_COMMUNITY)
Admission: RE | Disposition: A | Discharge status: Home / Self Care | Payer: Self-pay | Source: Ambulatory Visit | Attending: Internal Medicine

## 2015-09-19 DIAGNOSIS — E785 Hyperlipidemia, unspecified: Secondary | ICD-10-CM | POA: Insufficient documentation

## 2015-09-19 DIAGNOSIS — I10 Essential (primary) hypertension: Secondary | ICD-10-CM | POA: Diagnosis not present

## 2015-09-19 DIAGNOSIS — I252 Old myocardial infarction: Secondary | ICD-10-CM | POA: Diagnosis not present

## 2015-09-19 DIAGNOSIS — R1314 Dysphagia, pharyngoesophageal phase: Secondary | ICD-10-CM | POA: Diagnosis not present

## 2015-09-19 DIAGNOSIS — Z87891 Personal history of nicotine dependence: Secondary | ICD-10-CM | POA: Diagnosis not present

## 2015-09-19 DIAGNOSIS — Z79899 Other long term (current) drug therapy: Secondary | ICD-10-CM | POA: Diagnosis not present

## 2015-09-19 DIAGNOSIS — F329 Major depressive disorder, single episode, unspecified: Secondary | ICD-10-CM | POA: Diagnosis not present

## 2015-09-19 DIAGNOSIS — I2510 Atherosclerotic heart disease of native coronary artery without angina pectoris: Secondary | ICD-10-CM | POA: Insufficient documentation

## 2015-09-19 DIAGNOSIS — R1319 Other dysphagia: Secondary | ICD-10-CM | POA: Insufficient documentation

## 2015-09-19 DIAGNOSIS — Z86010 Personal history of colonic polyps: Secondary | ICD-10-CM | POA: Insufficient documentation

## 2015-09-19 DIAGNOSIS — Z7982 Long term (current) use of aspirin: Secondary | ICD-10-CM | POA: Insufficient documentation

## 2015-09-19 DIAGNOSIS — M1990 Unspecified osteoarthritis, unspecified site: Secondary | ICD-10-CM | POA: Insufficient documentation

## 2015-09-19 DIAGNOSIS — G4700 Insomnia, unspecified: Secondary | ICD-10-CM | POA: Diagnosis not present

## 2015-09-19 DIAGNOSIS — K210 Gastro-esophageal reflux disease with esophagitis: Secondary | ICD-10-CM | POA: Insufficient documentation

## 2015-09-19 DIAGNOSIS — R933 Abnormal findings on diagnostic imaging of other parts of digestive tract: Secondary | ICD-10-CM | POA: Diagnosis not present

## 2015-09-19 DIAGNOSIS — K449 Diaphragmatic hernia without obstruction or gangrene: Secondary | ICD-10-CM | POA: Diagnosis not present

## 2015-09-19 DIAGNOSIS — R935 Abnormal findings on diagnostic imaging of other abdominal regions, including retroperitoneum: Secondary | ICD-10-CM | POA: Insufficient documentation

## 2015-09-19 DIAGNOSIS — Z888 Allergy status to other drugs, medicaments and biological substances status: Secondary | ICD-10-CM | POA: Insufficient documentation

## 2015-09-19 DIAGNOSIS — R1310 Dysphagia, unspecified: Secondary | ICD-10-CM

## 2015-09-19 HISTORY — PX: ESOPHAGOGASTRODUODENOSCOPY: SHX5428

## 2015-09-19 HISTORY — PX: ESOPHAGEAL DILATION: SHX303

## 2015-09-19 SURGERY — EGD (ESOPHAGOGASTRODUODENOSCOPY)
Anesthesia: Moderate Sedation

## 2015-09-19 MED ORDER — MEPERIDINE HCL 50 MG/ML IJ SOLN
INTRAMUSCULAR | Status: DC
Start: 2015-09-19 — End: 2015-09-19
  Filled 2015-09-19: qty 1

## 2015-09-19 MED ORDER — PANTOPRAZOLE SODIUM 40 MG PO TBEC: 40.0000 mg | DELAYED_RELEASE_TABLET | Freq: Every day | ORAL | Status: AC

## 2015-09-19 MED ORDER — SODIUM CHLORIDE 0.9 % IV SOLN: INTRAVENOUS | Status: DC

## 2015-09-19 MED ORDER — STERILE WATER FOR IRRIGATION IR SOLN
Status: DC | PRN
  Administered 2015-09-19: 2.5 mL

## 2015-09-19 MED ORDER — MEPERIDINE HCL 50 MG/ML IJ SOLN
INTRAMUSCULAR | Status: DC | PRN
  Administered 2015-09-19 (×2): 25 mg via INTRAVENOUS

## 2015-09-19 MED ORDER — BUTAMBEN-TETRACAINE-BENZOCAINE 2-2-14 % EX AERO
INHALATION_SPRAY | CUTANEOUS | Status: DC | PRN
  Administered 2015-09-19: 2 via TOPICAL

## 2015-09-19 MED ORDER — MIDAZOLAM HCL 5 MG/5ML IJ SOLN
INTRAMUSCULAR | Status: AC
  Filled 2015-09-19: qty 10

## 2015-09-19 MED ORDER — MIDAZOLAM HCL 5 MG/5ML IJ SOLN
INTRAMUSCULAR | Status: DC | PRN
  Administered 2015-09-19 (×5): 2 mg via INTRAVENOUS

## 2015-09-19 NOTE — Op Note (Signed)
[facility] Patient Name: Rodney Decker Procedure Date: 09/19/2015 3:18 PM MRN: [id number] Date of Birth: 06-18-1940 Attending MD: Hildred Laser , MD CSN: [id number] Age: 75 Admit Type: Outpatient Procedure:                Upper GI endoscopy Indications:              Esophageal dysphagia, Abnormal abdominal x-ray of                            the GI tract Providers:                Hildred Laser, MD, Lurline Del, RN, Georgeann Oppenheim,                            Technician Referring MD:             [employer], MD Medicines:                Cetacaine spray, Meperidine 50 mg IV, Midazolam 10                            mg IV Complications:            No immediate complications. Estimated Blood Loss:     Estimated blood loss: none. Procedure:                Pre-Anesthesia Assessment:                           - Prior to the procedure, a History and Physical                            was performed, and patient medications and                            allergies were reviewed. The patient's tolerance of                            previous anesthesia was also reviewed. The risks                            and benefits of the procedure and the sedation                            options and risks were discussed with the patient.                            All questions were answered, and informed consent                            was obtained. Prior Anticoagulants: The patient                            last took aspirin 2 days prior to the procedure.  ASA Grade Assessment: III - A patient with severe                            systemic disease. After reviewing the risks and                            benefits, the patient was deemed in satisfactory                            condition to undergo the procedure.                           After obtaining informed consent, the endoscope was                            passed under direct vision. Throughout the                             procedure, the patient's blood pressure, pulse, and                            oxygen saturations were monitored continuously. The                            [id number]) was introduced through the mouth,                            and advanced to the second part of duodenum. The                            upper GI endoscopy was accomplished without                            difficulty. The patient tolerated the procedure                            well. Scope In: 3:29:19 PM Scope Out: 3:38:48 PM Total Procedure Duration: 0 hours 9 minutes 29 seconds  Findings:      The examined esophagus was normal.      The Z-line was regular and was found 38 cm from the incisors.      LA Grade A (one or more mucosal breaks less than 5 mm, not extending       between tops of 2 mucosal folds) esophagitis was found 38 cm from the       incisors.      No endoscopic abnormality was evident in the esophagus to explain the       patient's complaint of dysphagia. It was decided, however, to proceed       with dilation of the entire esophagus. The scope was withdrawn. Dilation       was performed with a Maloney dilator with no resistance at 70 Fr. The       dilation site was examined following endoscope reinsertion and showed no       change. Estimated blood loss: none.      The entire examined  stomach was normal.      The duodenal bulb and second portion of the duodenum were normal. Impression:               - Normal esophagus.                           - Z-line regular, 38 cm from the incisors.                           - LA Grade A reflux esophagitis(erosion at GE                            junction).                           - No endoscopic esophageal abnormality to explain                            patient's dysphagia. Esophagus dilated. Dilated.                           - Normal stomach.                           - Normal duodenal bulb and second portion of the                             duodenum.                           - No specimens collected. Moderate Sedation:      Moderate (conscious) sedation was administered by the endoscopy nurse       and supervised by the endoscopist. The following parameters were       monitored: oxygen saturation, heart rate, blood pressure, CO2       capnography and response to care. Total physician intraservice time was       18 minutes. Recommendation:           - Patient has a contact number available for                            emergencies. The signs and symptoms of potential                            delayed complications were discussed with the                            patient. Return to normal activities tomorrow.                            Written discharge instructions were provided to the                            patient.                           - Resume previous  diet today.                           - Continue present medications.                           - Telephone GI office in 1 week.                           - Use Protonix (pantoprazole) 40 mg PO daily. Procedure Code(s):        --- Professional ---                           [phone], Esophagogastroduodenoscopy, flexible,                            transoral; diagnostic, including collection of                            specimen(s) by brushing or washing, when performed                            (separate procedure)                           43450, Dilation of esophagus, by unguided sound or                            bougie, single or multiple passes                           99152, Moderate sedation services provided by the                            same physician or other qualified health care                            professional performing the diagnostic or                            therapeutic service that the sedation supports,                            requiring the presence of an independent trained                             observer to assist in the monitoring of the                            patient's level of consciousness and physiological                            status; initial 15 minutes of intraservice time,                            patient age 36  years or older Diagnosis Code(s):        --- Professional ---                           K21.0, Gastro-esophageal reflux disease with                            esophagitis                           R13.14, Dysphagia, pharyngoesophageal phase                           R93.3, Abnormal findings on diagnostic imaging of                            other parts of digestive tract CPT copyright 2016 [employer]. All rights reserved. The codes documented in this report are preliminary and upon coder review may  be revised to meet current compliance requirements. Hildred Laser, MD Hildred Laser, MD 09/19/2015 3:49:33 PM This report has been signed electronically. Number of Addenda: 0

## 2015-09-19 NOTE — Discharge Instructions (Signed)
Resume usual medications and diet. Pantoprazole 40 mg by mouth 30 minutes before breakfast daily. No driving for 24 hours. Please call office with progress report in one week. If swallowing difficulty does not improve you will need further tests.  Esophagogastroduodenoscopy, Care After Refer to this sheet in the next few weeks. These instructions provide you with information about caring for yourself after your procedure. Your health care provider may also give you more specific instructions. Your treatment has been planned according to current medical practices, but problems sometimes occur. Call your health care provider if you have any problems or questions after your procedure. WHAT TO EXPECT AFTER THE PROCEDURE After your procedure, it is typical to feel:  Soreness in your throat.  Pain with swallowing.  Sick to your stomach (nauseous).  Bloated.  Dizzy.  Fatigued. HOME CARE INSTRUCTIONS  Do not eat or drink anything until the numbing medicine (local anesthetic) has worn off and your gag reflex has returned. You will know that the local anesthetic has worn off when you can swallow comfortably.  Do not drive or operate machinery until directed by your health care provider.  Take medicines only as directed by your health care provider. SEEK MEDICAL CARE IF:   You cannot stop coughing.  You are not urinating at all or less than usual. SEEK IMMEDIATE MEDICAL CARE IF:  You have difficulty swallowing.  You cannot eat or drink.  You have worsening throat or chest pain.  You have dizziness or lightheadedness or you faint.  You have nausea or vomiting.  You have chills.  You have a fever.  You have severe abdominal pain.  You have black, tarry, or bloody stools.   This information is not intended to replace advice given to you by your health care provider. Make sure you discuss any questions you have with your health care provider.   Document Released: 03/08/2012  Document Revised: 04/12/2014 Document Reviewed: 03/08/2012 Elsevier Interactive Patient Education 2016 [employer]   Pantoprazole tablets What is this medicine? PANTOPRAZOLE (pan TOE pra zole) prevents the production of acid in the stomach. It is used to treat gastroesophageal reflux disease (GERD), inflammation of the esophagus, and Zollinger-Ellison syndrome. This medicine may be used for other purposes; ask your health care provider or pharmacist if you have questions. What should I tell my health care provider before I take this medicine? They need to know if you have any of these conditions: -liver disease -low levels of magnesium in the blood -an unusual or allergic reaction to omeprazole, lansoprazole, pantoprazole, rabeprazole, other medicines, foods, dyes, or preservatives -pregnant or trying to get pregnant -breast-feeding How should I use this medicine? Take this medicine by mouth. Swallow the tablets whole with a drink of water. Follow the directions on the prescription label. Do not crush, break, or chew. Take your medicine at regular intervals. Do not take your medicine more often than directed. Talk to your pediatrician regarding the use of this medicine in children. While this drug may be prescribed for children as young as 5 years for selected conditions, precautions do apply. Overdosage: If you think you have taken too much of this medicine contact a poison control center or emergency room at once. NOTE: This medicine is only for you. Do not share this medicine with others. What if I miss a dose? If you miss a dose, take it as soon as you can. If it is almost time for your next dose, take only that dose. Do not take  double or extra doses. What may interact with this medicine? Do not take this medicine with any of the following medications: -atazanavir -nelfinavir This medicine may also interact with the following  medications: -ampicillin -delavirdine -erlotinib -iron salts -medicines for fungal infections like ketoconazole, itraconazole and voriconazole -methotrexate -mycophenolate mofetil -warfarin This list may not describe all possible interactions. Give your health care provider a list of all the medicines, herbs, non-prescription drugs, or dietary supplements you use. Also tell them if you smoke, drink alcohol, or use illegal drugs. Some items may interact with your medicine. What should I watch for while using this medicine? It can take several days before your stomach pain gets better. Check with your doctor or health care professional if your condition does not start to get better, or if it gets worse. You may need blood work done while you are taking this medicine. What side effects may I notice from receiving this medicine? Side effects that you should report to your doctor or health care professional as soon as possible: -allergic reactions like skin rash, itching or hives, swelling of the face, lips, or tongue -bone, muscle or joint pain -breathing problems -chest pain or chest tightness -dark yellow or brown urine -dizziness -fast, irregular heartbeat -feeling faint or lightheaded -fever or sore throat -muscle spasm -palpitations -redness, blistering, peeling or loosening of the skin, including inside the mouth -seizures -tremors -unusual bleeding or bruising -unusually weak or tired -yellowing of the eyes or skin Side effects that usually do not require medical attention (Report these to your doctor or health care professional if they continue or are bothersome.): -constipation -diarrhea -dry mouth -headache -nausea This list may not describe all possible side effects. Call your doctor for medical advice about side effects. You may report side effects to FDA at 1-800-FDA-1088. Where should I keep my medicine? Keep out of the reach of children. Store at room temperature  between 15 and 30 degrees C (59 and 86 degrees F). Protect from light and moisture. Throw away any unused medicine after the expiration date. NOTE: This sheet is a summary. It may not cover all possible information. If you have questions about this medicine, talk to your doctor, pharmacist, or health care provider.    2016, Elsevier/Gold Standard. (2014-05-10 14:45:56)

## 2015-09-19 NOTE — H&P (Signed)
Rodney Decker is an 75 y.o. male.   Chief Complaint: Patient is here for EGD and ED. HPI: 75 year old Caucasian male with multiple medical problems presents with six-day history of solid food dysphagia. He states it started after his cardiology bypass graft. It is occurring more and more frequently. He has no difficulty swallowing liquids. He also complains of frequent heartburn and postprandial nausea which may last for 30-60 minutes but he never vomits. He denies melena or rectal bleeding. Patient was evaluated by Dr. Riley Kill and underwent barium pill study and 07/25/2015 which revealed distal esophageal stricture preventing passage of barium pill into the stomach.  Past Medical History  Diagnosis Date  . Hypertension   . CAD (coronary artery disease)   . Insomnia   . Depression   . Colon polyps   . GERD (gastroesophageal reflux disease)   . Dyslipidemia   . Myocardial infarction ([place]) 2011  . Arthritis   . Ruptured lumbar disc     Past Surgical History  Procedure Laterality Date  . Colonoscopy 10/10/2001 for surveillqance for colonic polyps.      Biopsy revealed hyperplastic polyps. Next colonoscopy in 3 yrs.  Marland Kitchen Appendectomy    . Colonoscopy  03/24/2011    Procedure: COLONOSCOPY;  Surgeon: Rogene Houston, MD;  Location: [facility];  Service: Endoscopy;  Laterality: N/A;  9:30  . Cardiac surgery    . Cardiac catheterization  04/17/2009    referred for CABG  . Coronary artery bypass graft  2011    x5   . Hernia repair      left inguinal- Dr Romona Curls  . Cataract extraction w/phaco Right 08/13/2013    Procedure: CATARACT EXTRACTION PHACO AND INTRAOCULAR LENS PLACEMENT (IOC);  Surgeon: Tonny Branch, MD;  Location: [facility];  Service: Ophthalmology;  Laterality: Right;  CDE 7.11  . Cataract extraction w/phaco Left 09/06/2013    Procedure: CATARACT EXTRACTION PHACO AND INTRAOCULAR LENS PLACEMENT (IOC);  Surgeon: Tonny Branch, MD;  Location: [facility];  Service: Ophthalmology;  Laterality:  Left;  CDE: 9.87  . Colonoscopy N/A 12/20/2013    Procedure: COLONOSCOPY;  Surgeon: Rogene Houston, MD;  Location: [facility];  Service: Endoscopy;  Laterality: N/A;  930    Family History  Problem Relation Age of Onset  . Colon cancer Neg Hx    Social History:  reports that he quit smoking about 10 years ago. His smoking use included Cigarettes. He started smoking about 54 years ago. He has a 20 pack-year smoking history. He has never used smokeless tobacco. He reports that he does not drink alcohol or use illicit drugs.  Allergies:  Allergies  Allergen Reactions  . Claritin [Loratadine] Other (See Comments)    unknown  . Cymbalta [Duloxetine Hcl] Rash    Medications Prior to Admission  Medication Sig Dispense Refill  . aspirin EC 81 MG tablet Take 81 mg by mouth daily.    Marland Kitchen atorvastatin (LIPITOR) 40 MG tablet Take 1 tablet (40 mg total) by mouth daily. 30 tablet 6  . buPROPion (WELLBUTRIN XL) 300 MG 24 hr tablet Take 300 mg by mouth daily.    Marland Kitchen HYDROcodone-acetaminophen (NORCO) 10-325 MG tablet TK 1 T PO Q 3 H PRN. MAX 6 PER DAY  0  . lisinopril (PRINIVIL,ZESTRIL) 5 MG tablet Take 5 mg by mouth daily.     Marland Kitchen LORazepam (ATIVAN) 2 MG tablet Take 1 tablet (2 mg total) by mouth 2 (two) times daily. 30 tablet 0  . promethazine (  PHENERGAN) 25 MG tablet Take 25 mg by mouth every 6 (six) hours as needed for nausea or vomiting.    . tamsulosin (FLOMAX) 0.4 MG CAPS capsule Take 0.4 mg by mouth.    . traMADol (ULTRAM) 50 MG tablet Take by mouth every 6 (six) hours as needed for moderate pain.     . nitroGLYCERIN (NITROSTAT) 0.4 MG SL tablet Place 1 tablet (0.4 mg total) under the tongue every 5 (five) minutes as needed for chest pain. 25 tablet 3    No results found for this or any previous visit (from the past 48 hour(s)). No results found.  ROS  Blood pressure 134/82, pulse 60, temperature 98.7 F (37.1 C), temperature source Oral, SpO2 96 %. Physical Exam  Constitutional: He  appears well-developed and well-nourished.  HENT:  Mouth/Throat: Oropharynx is clear and moist.  Eyes: Conjunctivae are normal. No scleral icterus.  Neck: No thyromegaly present.  Cardiovascular: Normal rate, regular rhythm and normal heart sounds.   No murmur heard. Respiratory: Effort normal and breath sounds normal.  GI: Soft. He exhibits no distension and no mass. There is no tenderness.  Musculoskeletal: He exhibits no edema.  Lymphadenopathy:    He has no cervical adenopathy.  Neurological: He is alert.  Skin: Skin is warm and dry.     Assessment/Plan Solid food dysphagia. Abnormal barium pill esophagogram. EGD with ED.  Hildred Laser, MD 09/19/2015, 3:14 PM

## 2015-09-25 ENCOUNTER — Encounter (HOSPITAL_COMMUNITY): Payer: Self-pay | Admitting: Internal Medicine

## 2015-10-30 DIAGNOSIS — Z1389 Encounter for screening for other disorder: Secondary | ICD-10-CM | POA: Diagnosis not present

## 2015-10-30 DIAGNOSIS — E782 Mixed hyperlipidemia: Secondary | ICD-10-CM | POA: Diagnosis not present

## 2015-10-30 DIAGNOSIS — N400 Enlarged prostate without lower urinary tract symptoms: Secondary | ICD-10-CM | POA: Diagnosis not present

## 2015-10-30 DIAGNOSIS — K219 Gastro-esophageal reflux disease without esophagitis: Secondary | ICD-10-CM | POA: Diagnosis not present

## 2015-10-30 DIAGNOSIS — I708 Atherosclerosis of other arteries: Secondary | ICD-10-CM | POA: Diagnosis not present

## 2015-10-30 DIAGNOSIS — Z6829 Body mass index (BMI) 29.0-29.9, adult: Secondary | ICD-10-CM | POA: Diagnosis not present

## 2015-10-30 DIAGNOSIS — E663 Overweight: Secondary | ICD-10-CM | POA: Diagnosis not present

## 2015-10-30 DIAGNOSIS — E210 Primary hyperparathyroidism: Secondary | ICD-10-CM | POA: Diagnosis not present

## 2015-10-30 DIAGNOSIS — F419 Anxiety disorder, unspecified: Secondary | ICD-10-CM | POA: Diagnosis not present

## 2015-10-30 DIAGNOSIS — Z79899 Other long term (current) drug therapy: Secondary | ICD-10-CM | POA: Diagnosis not present

## 2015-10-31 ENCOUNTER — Other Ambulatory Visit (HOSPITAL_COMMUNITY): Payer: Self-pay | Admitting: Internal Medicine

## 2015-10-31 DIAGNOSIS — I25111 Atherosclerotic heart disease of native coronary artery with angina pectoris with documented spasm: Secondary | ICD-10-CM

## 2015-11-05 ENCOUNTER — Ambulatory Visit (HOSPITAL_COMMUNITY)
Admission: RE | Admit: 2015-11-05 | Discharge: 2015-11-05 | Disposition: A | Discharge status: Home / Self Care | Payer: Medicare Other | Source: Ambulatory Visit | Attending: Internal Medicine | Admitting: Internal Medicine

## 2015-11-05 DIAGNOSIS — I2510 Atherosclerotic heart disease of native coronary artery without angina pectoris: Secondary | ICD-10-CM | POA: Insufficient documentation

## 2015-11-05 DIAGNOSIS — I25111 Atherosclerotic heart disease of native coronary artery with angina pectoris with documented spasm: Secondary | ICD-10-CM

## 2015-11-05 DIAGNOSIS — I6523 Occlusion and stenosis of bilateral carotid arteries: Secondary | ICD-10-CM | POA: Diagnosis not present

## 2015-11-12 DIAGNOSIS — Z1389 Encounter for screening for other disorder: Secondary | ICD-10-CM | POA: Diagnosis not present

## 2015-12-09 ENCOUNTER — Other Ambulatory Visit: Payer: Self-pay

## 2015-12-29 DIAGNOSIS — R3129 Other microscopic hematuria: Secondary | ICD-10-CM | POA: Diagnosis not present

## 2015-12-29 DIAGNOSIS — F419 Anxiety disorder, unspecified: Secondary | ICD-10-CM | POA: Diagnosis not present

## 2015-12-29 DIAGNOSIS — E663 Overweight: Secondary | ICD-10-CM | POA: Diagnosis not present

## 2015-12-29 DIAGNOSIS — Z6829 Body mass index (BMI) 29.0-29.9, adult: Secondary | ICD-10-CM | POA: Diagnosis not present

## 2015-12-29 DIAGNOSIS — N400 Enlarged prostate without lower urinary tract symptoms: Secondary | ICD-10-CM | POA: Diagnosis not present

## 2015-12-29 DIAGNOSIS — G894 Chronic pain syndrome: Secondary | ICD-10-CM | POA: Diagnosis not present

## 2016-02-13 IMAGING — CR DG CHEST 2V
2 series · 2 of 2 positions shown · non-contrast
Comparison: June 07, 2010.

CLINICAL DATA: Shortness of breath.

EXAM:
CHEST  2 VIEW

[view not recorded (1 of 2)]
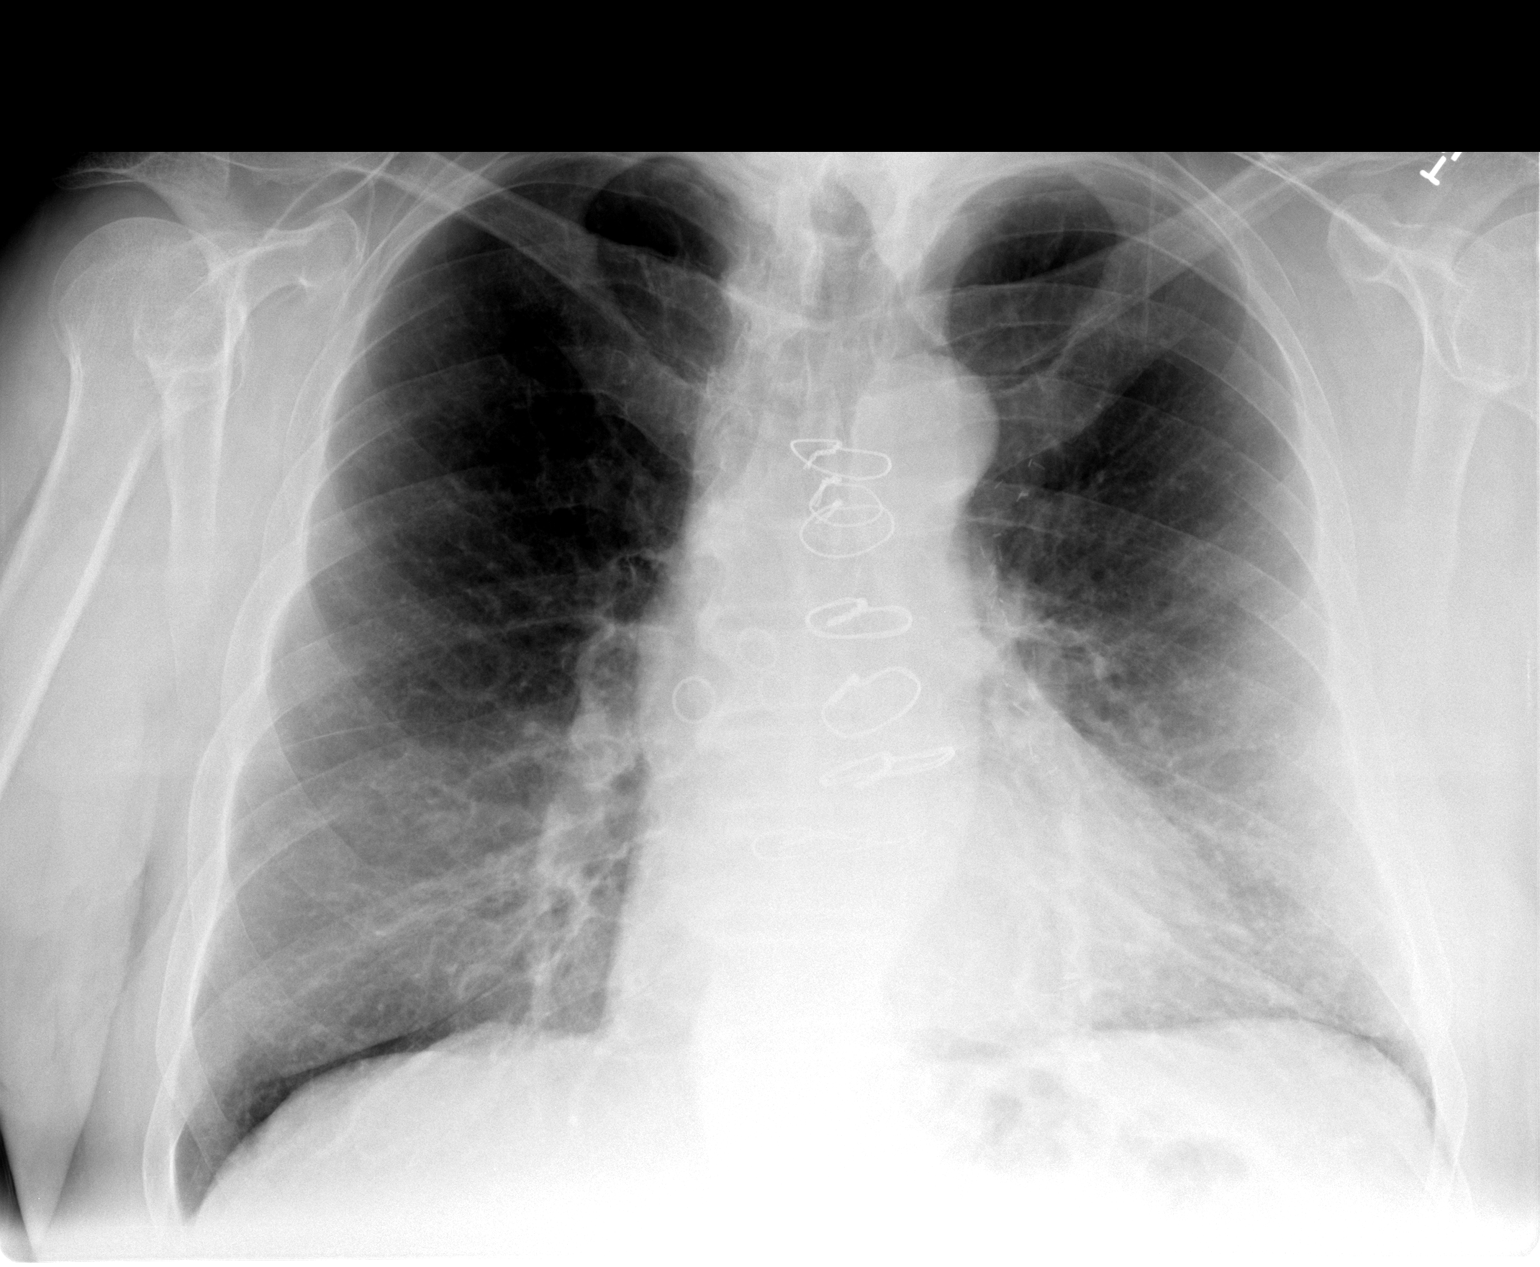

[view not recorded (2 of 2)]
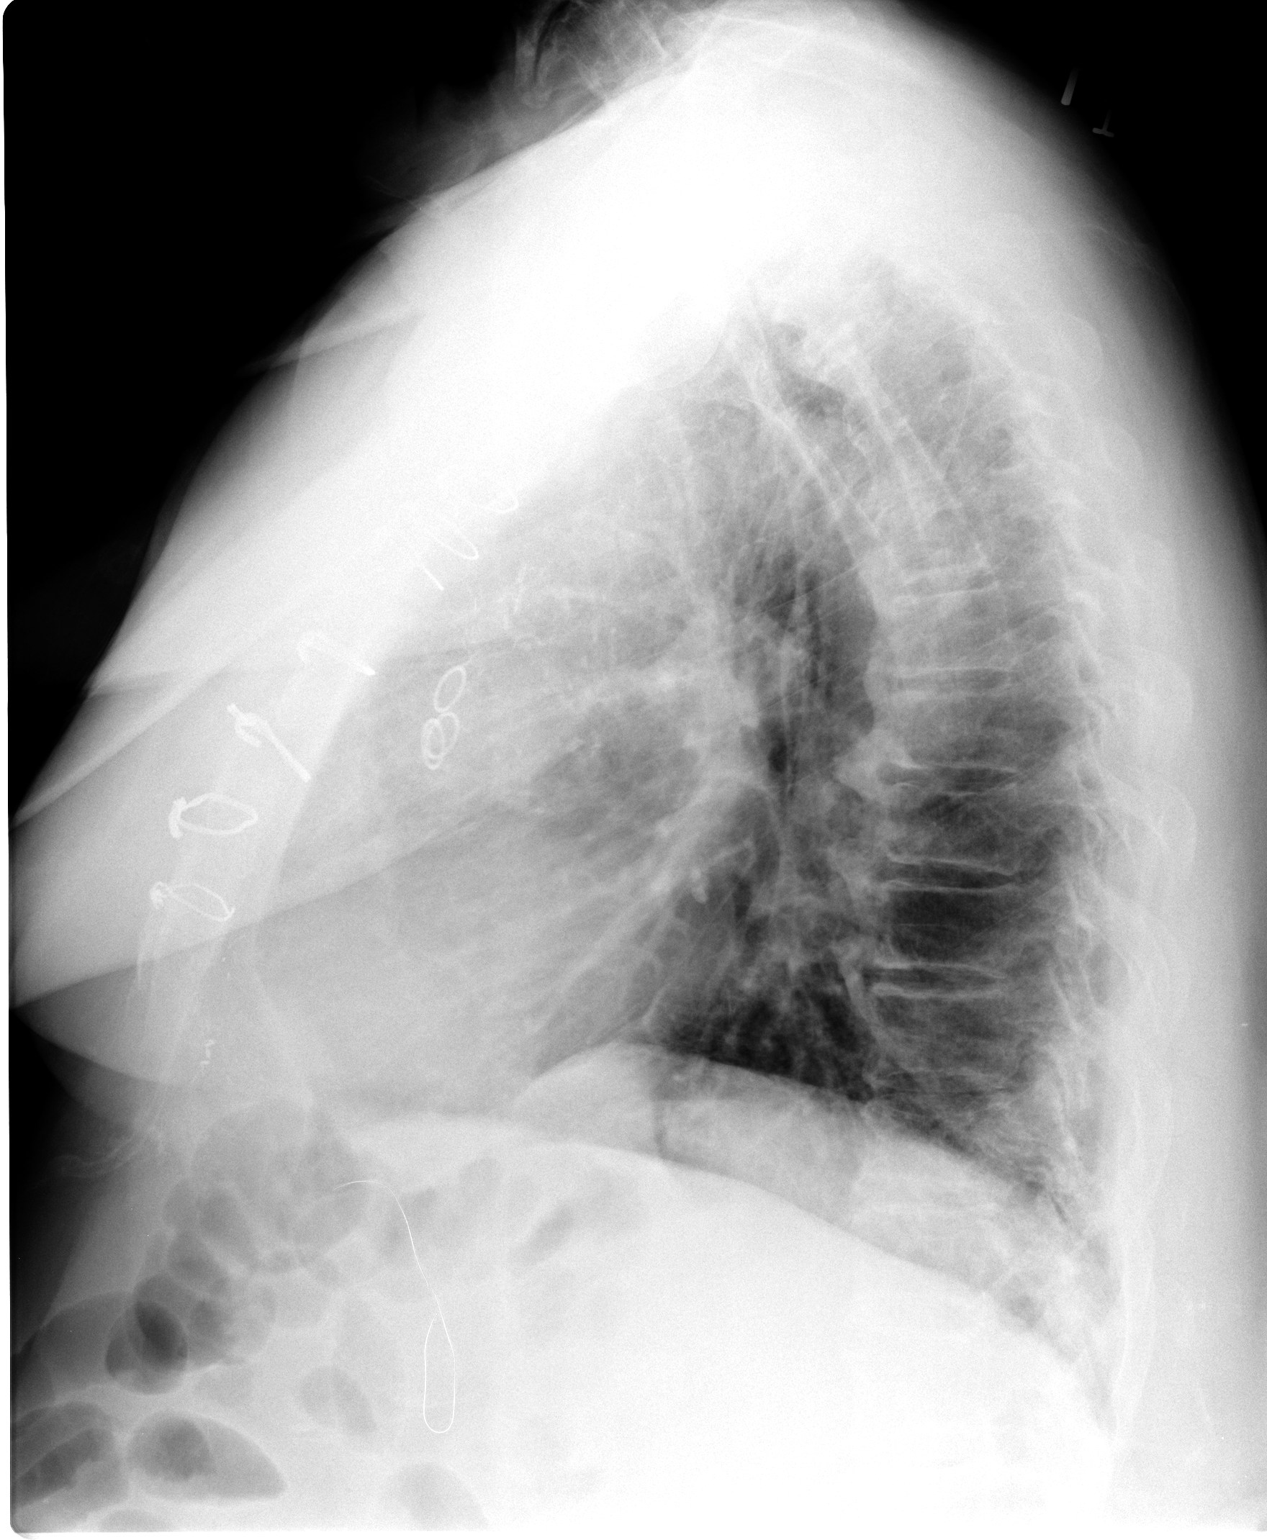

[2 of 2 positions shown; findings below may reference images not displayed]

FINDINGS: Stable cardiomediastinal silhouette. Status post coronary artery
bypass graft. Anterior osteophyte formation is noted in the mid
thoracic spine. No pneumothorax or pleural effusion is noted. No
acute pulmonary disease is noted. Bony thorax is intact.
IMPRESSION: No acute cardiopulmonary abnormality seen.

## 2016-03-10 DIAGNOSIS — G894 Chronic pain syndrome: Secondary | ICD-10-CM | POA: Diagnosis not present

## 2016-03-10 DIAGNOSIS — F419 Anxiety disorder, unspecified: Secondary | ICD-10-CM | POA: Diagnosis not present

## 2016-03-10 DIAGNOSIS — Z6828 Body mass index (BMI) 28.0-28.9, adult: Secondary | ICD-10-CM | POA: Diagnosis not present

## 2016-03-10 DIAGNOSIS — E210 Primary hyperparathyroidism: Secondary | ICD-10-CM | POA: Diagnosis not present

## 2016-03-10 DIAGNOSIS — Z23 Encounter for immunization: Secondary | ICD-10-CM | POA: Diagnosis not present

## 2016-04-02 ENCOUNTER — Emergency Department (HOSPITAL_COMMUNITY)
Admission: EM | Admit: 2016-04-02 | Discharge: 2016-04-02 | Disposition: A | Discharge status: Home / Self Care | Payer: Medicare Other | Attending: Emergency Medicine | Admitting: Emergency Medicine

## 2016-04-02 ENCOUNTER — Encounter (HOSPITAL_COMMUNITY): Payer: Self-pay

## 2016-04-02 DIAGNOSIS — I252 Old myocardial infarction: Secondary | ICD-10-CM | POA: Diagnosis not present

## 2016-04-02 DIAGNOSIS — I2510 Atherosclerotic heart disease of native coronary artery without angina pectoris: Secondary | ICD-10-CM | POA: Diagnosis not present

## 2016-04-02 DIAGNOSIS — I10 Essential (primary) hypertension: Secondary | ICD-10-CM | POA: Diagnosis not present

## 2016-04-02 DIAGNOSIS — R110 Nausea: Secondary | ICD-10-CM | POA: Diagnosis not present

## 2016-04-02 DIAGNOSIS — Z951 Presence of aortocoronary bypass graft: Secondary | ICD-10-CM | POA: Diagnosis not present

## 2016-04-02 DIAGNOSIS — J3489 Other specified disorders of nose and nasal sinuses: Secondary | ICD-10-CM | POA: Diagnosis not present

## 2016-04-02 DIAGNOSIS — R630 Anorexia: Secondary | ICD-10-CM | POA: Diagnosis not present

## 2016-04-02 DIAGNOSIS — R531 Weakness: Secondary | ICD-10-CM | POA: Diagnosis not present

## 2016-04-02 DIAGNOSIS — R197 Diarrhea, unspecified: Secondary | ICD-10-CM | POA: Diagnosis not present

## 2016-04-02 DIAGNOSIS — E860 Dehydration: Secondary | ICD-10-CM | POA: Diagnosis not present

## 2016-04-02 DIAGNOSIS — R600 Localized edema: Secondary | ICD-10-CM | POA: Diagnosis not present

## 2016-04-02 DIAGNOSIS — R0689 Other abnormalities of breathing: Secondary | ICD-10-CM | POA: Insufficient documentation

## 2016-04-02 DIAGNOSIS — R1084 Generalized abdominal pain: Secondary | ICD-10-CM | POA: Insufficient documentation

## 2016-04-02 LAB — CBC
HEMATOCRIT: 42.7 % (ref 39.0–52.0)
Hemoglobin: 14.3 g/dL (ref 13.0–17.0)
MCH: 31.8 pg (ref 26.0–34.0)
MCHC: 33.5 g/dL (ref 30.0–36.0)
MCV: 94.9 fL (ref 78.0–100.0)
Platelets: 293 10*3/uL (ref 150–400)
RBC: 4.5 MIL/uL (ref 4.22–5.81)
RDW: 13.9 % (ref 11.5–15.5)
WBC: 13.9 10*3/uL — AB (ref 4.0–10.5)

## 2016-04-02 LAB — COMPREHENSIVE METABOLIC PANEL
ALBUMIN: 4.4 g/dL (ref 3.5–5.0)
ALT: 43 U/L (ref 17–63)
AST: 70 U/L — AB (ref 15–41)
Alkaline Phosphatase: 73 U/L (ref 38–126)
Anion gap: 7 (ref 5–15)
BUN: 14 mg/dL (ref 6–20)
CHLORIDE: 103 mmol/L (ref 101–111)
CO2: 25 mmol/L (ref 22–32)
Calcium: 10.6 mg/dL — ABNORMAL HIGH (ref 8.9–10.3)
Creatinine, Ser: 0.86 mg/dL (ref 0.61–1.24)
GFR calc Af Amer: >60 mL/min (ref >60)
GLUCOSE: 107 mg/dL — AB (ref 65–99)
POTASSIUM: 4.1 mmol/L (ref 3.5–5.1)
Sodium: 135 mmol/L (ref 135–145)
Total Bilirubin: 0.9 mg/dL (ref 0.3–1.2)
Total Protein: 7.3 g/dL (ref 6.5–8.1)

## 2016-04-02 LAB — LIPASE, BLOOD: LIPASE: 20 U/L (ref 11–51)

## 2016-04-02 LAB — MAGNESIUM: Magnesium: 2.1 mg/dL (ref 1.7–2.4)

## 2016-04-02 LAB — BRAIN NATRIURETIC PEPTIDE: B Natriuretic Peptide: 116 pg/mL — ABNORMAL HIGH (ref 0.0–100.0)

## 2016-04-02 MED ORDER — SODIUM CHLORIDE 0.9 % IV BOLUS (SEPSIS)
1000.0000 mL | Freq: Once | INTRAVENOUS | Status: AC
  Administered 2016-04-02: 1000 mL via INTRAVENOUS

## 2016-04-02 NOTE — ED Notes (Signed)
Patient in restroom attempting to obtain stool sample.

## 2016-04-02 NOTE — ED Provider Notes (Signed)
[facility] DEPT Provider Note   CSN: [id number] Arrival date & time: 04/02/16  1729 By signing my name below, I, Dyke Brackett, attest that this documentation has been prepared under the direction and in the presence of Merrily Pew, MD . Electronically Signed: Dyke Brackett, Scribe. 04/02/2016. 8:51 PM.   History   Chief Complaint Chief Complaint  Patient presents with  . Diarrhea   HPI Rodney Decker is a 75 y.o. male with a hx of AMS, stroke, MI, CAD, chronic constipation and colonic polyps who presents to the Emergency Department complaining of frequent diarrhea which began today. He states he was initially constipated and took an enema, and then had intermittent diarrhea for 6 hours. He notes associated abdominal pain, edema to BLE, decreased appetite and progressively worsening weakness. He states he hasn't eaten in two days. He also complains of rhinorrhea and voice hoarseness onset 24 hours ago. Pt states "all of a sudden I have flu symptoms:" No alleviating or modifying factors noted. He recently discontinued taking Ativan. Pt denies any recent fall, injury or trauma. Pt denies any cough.  The history is provided by the patient. No language interpreter was used.    Past Medical History:  Diagnosis Date  . Arthritis   . CAD (coronary artery disease)   . Colon polyps   . Depression   . Dyslipidemia   . GERD (gastroesophageal reflux disease)   . Hypertension   . Insomnia   . Myocardial infarction 2011  . Ruptured lumbar disc     Patient Active Problem List   Diagnosis Date Noted  . Anxiety 04/08/2015  . Fall 12/31/2014  . Acute encephalopathy 12/31/2014  . 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 Syncope 12/29/2014  . Altered mental status 12/29/2014  . UTI (lower urinary tract infection) 12/29/2014  . ERRONEOUS ENCOUNTER--DISREGARD 02/04/2014  . 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 Hypertrophy of prostate without urinary obstruction and other lower urinary  tract symptoms (LUTS) 10/12/2012  . Other chronic pain 10/12/2012  . Coronary atherosclerosis of unspecified type of vessel, native or graft 10/12/2012  . COLONIC POLYPS 12/20/2008  . CIGARETTE SMOKER 12/20/2008  . GERD 12/20/2008  . CONSTIPATION, CHRONIC 12/20/2008  . OTHER&UNSPECIFIED [place] DISORDER THORACIC REGION 12/20/2008  . LOW BACK PAIN, CHRONIC 12/20/2008  . DYSPHAGIA UNSPECIFIED 12/20/2008  . DYSURIA, HX OF 12/20/2008    Past Surgical History:  Procedure Laterality Date  . APPENDECTOMY    . CARDIAC CATHETERIZATION  04/17/2009   referred for CABG  . CARDIAC SURGERY    . CATARACT EXTRACTION W/PHACO Right 08/13/2013   Procedure: CATARACT EXTRACTION PHACO AND INTRAOCULAR LENS PLACEMENT (IOC);  Surgeon: Tonny Branch, MD;  Location: [facility];  Service: Ophthalmology;  Laterality: Right;  CDE 7.11  . CATARACT EXTRACTION W/PHACO Left 09/06/2013   Procedure: CATARACT EXTRACTION PHACO AND INTRAOCULAR LENS PLACEMENT (IOC);  Surgeon: Tonny Branch, MD;  Location: [facility];  Service: Ophthalmology;  Laterality: Left;  CDE: 9.87  . COLONOSCOPY  03/24/2011   Procedure: COLONOSCOPY;  Surgeon: Rogene Houston, MD;  Location: [facility];  Service: Endoscopy;  Laterality: N/A;  9:30  . COLONOSCOPY N/A 12/20/2013   Procedure: COLONOSCOPY;  Surgeon: Rogene Houston, MD;  Location: [facility];  Service: Endoscopy;  Laterality: N/A;  930  . Colonoscopy 10/10/2001 for surveillqance for colonic polyps.     Biopsy revealed hyperplastic polyps. Next colonoscopy in 3 yrs.  . CORONARY ARTERY BYPASS GRAFT  2011   x5   . ESOPHAGEAL DILATION  N/A 09/19/2015   Procedure: ESOPHAGEAL DILATION;  Surgeon: Rogene Houston, MD;  Location: [facility];  Service: Endoscopy;  Laterality: N/A;  . ESOPHAGOGASTRODUODENOSCOPY N/A 09/19/2015   Procedure: ESOPHAGOGASTRODUODENOSCOPY (EGD);  Surgeon: Rogene Houston, MD;  Location: [facility];  Service: Endoscopy;  Laterality: N/A;  2:40 - moved to 12:30 - Ann to notify - moved  to 1:30 - LM for pt to arrive at 12:30  . HERNIA REPAIR     left inguinal- Dr Romona Curls    Home Medications    Prior to Admission medications   Medication Sig Start Date End Date Taking? Authorizing Provider  HYDROcodone-acetaminophen (NORCO) 10-325 MG tablet TAKE ONE TABLET BY MOUTH EVERY 3 HOURS AS NEEDED FOR PAIN 12/30/15   Historical Provider, MD  LORazepam (ATIVAN) 2 MG tablet TAKE ONE TABLET BY MOUTH 4 TIMES DAILY 03/11/16   Historical Provider, MD  pantoprazole (PROTONIX) 40 MG tablet Take 1 tablet (40 mg total) by mouth daily before breakfast. Patient taking differently: Take 40 mg by mouth every morning.  09/19/15   Rogene Houston, MD    Family History Family History  Problem Relation Age of Onset  . Colon cancer Neg Hx     Social History Social History  Substance Use Topics  . Smoking status: Former Smoker    Packs/day: 0.50    Years: 40.00    Types: Cigarettes    Start date: 05/29/1961    Quit date: 03/14/2005  . Smokeless tobacco: Never Used     Comment: Patient states that he smoked 10 cigarettes a day  . Alcohol use No    Allergies   Claritin [loratadine] and Cymbalta [duloxetine hcl]  Review of Systems Review of Systems  Constitutional: Positive for appetite change.  HENT: Positive for rhinorrhea and voice change.   Respiratory: Negative for cough.   Cardiovascular: Positive for leg swelling.  Gastrointestinal: Positive for abdominal pain and diarrhea.  Neurological: Positive for weakness.  All other systems reviewed and are negative.  Physical Exam Updated Vital Signs BP 139/77 (BP Location: Right Arm)   Pulse 89   Temp 97.9 F (36.6 C) (Temporal)   Resp 18   Ht 5\' 11"  (1.803 m)   Wt 175 lb (79.4 kg)   SpO2 97%   BMI 24.41 kg/m   Physical Exam  Constitutional: He is oriented to person, place, and time. He appears well-developed and well-nourished. No distress.  HENT:  Head: Normocephalic and atraumatic.  Dry mucus membranes. Little bit of  dried blood on left cheek.   Eyes: Conjunctivae are normal.  Cardiovascular: Normal rate, regular rhythm and normal heart sounds.  Exam reveals no friction rub.   No murmur heard. Pulmonary/Chest: Effort normal.  Abdominal: Bowel sounds are normal. He exhibits no mass. There is tenderness. There is no rebound and no guarding.  Mild diffuse tenderness  Musculoskeletal: He exhibits edema.  Mild lower extremity edema that is not pitting.   Neurological: He is alert and oriented to person, place, and time.  Skin: Skin is warm and dry.  Psychiatric: He has a normal mood and affect.  Nursing note and vitals reviewed.  ED Treatments / Results  DIAGNOSTIC STUDIES:  Oxygen Saturation is 98% on RA, normal by my interpretation.    COORDINATION OF CARE:  8:45 PM Discussed treatment plan with pt at bedside and pt agreed to plan.   Labs (all labs ordered are listed, but only abnormal results are displayed) Labs Reviewed  COMPREHENSIVE METABOLIC PANEL -  Abnormal; Notable for the following:       Result Value   Glucose, Bld 107 (*)    Calcium 10.6 (*)    AST 70 (*)    All other components within normal limits  CBC - Abnormal; Notable for the following:    WBC 13.9 (*)    All other components within normal limits  BRAIN NATRIURETIC PEPTIDE - Abnormal; Notable for the following:    B Natriuretic Peptide 116.0 (*)    All other components within normal limits  GASTROINTESTINAL PANEL BY PCR, STOOL (REPLACES STOOL CULTURE)  MAGNESIUM  LIPASE, BLOOD    EKG  EKG Interpretation  Date/Time:  Friday April 02 2016 21:14:48 EST Ventricular Rate:  77 PR Interval:    QRS Duration: 88 QT Interval:  371 QTC Calculation: 420 R Axis:   44 Text Interpretation:  Sinus rhythm No significant change since last tracing Confirmed by [facility] MD, Corene Cornea [id number]) on 04/02/2016 9:40:42 PM       Radiology No results found.  Procedures Procedures (including critical care time)  Medications Ordered in  ED Medications  sodium chloride 0.9 % bolus 1,000 mL (0 mLs Intravenous Stopped 04/02/16 2200)     Initial Impression / Assessment and Plan / ED Course  I have reviewed the triage vital signs and the nursing notes.  Pertinent labs & imaging results that were available during my care of the patient were reviewed by me and considered in my medical decision making (see chart for details).  Clinical Course     Diarrhea. Unsure of cause. Improved strength with fluids.  Has multiple other complaints as well but diarrhea is main one and pathogen panel collected. otherwise plan for continued hydration and pcp follow up if not improving in a few days.   Final Clinical Impressions(s) / ED Diagnoses   Final diagnoses:  Diarrhea, unspecified type    New Prescriptions Discharge Medication List as of 04/02/2016 10:40 PM      I personally performed the services described in this documentation, which was scribed in my presence. The recorded information has been reviewed and is accurate.     Merrily Pew, MD 04/03/16 [phone]

## 2016-04-02 NOTE — ED Notes (Signed)
ED Provider at bedside. 

## 2016-04-02 NOTE — ED Triage Notes (Signed)
Pt coming EMS for diarrhea, and bilateral edema

## 2016-04-29 DIAGNOSIS — R404 Transient alteration of awareness: Secondary | ICD-10-CM | POA: Diagnosis not present

## 2016-05-06 DIAGNOSIS — 419620001 Death: Secondary | SNOMED CT | POA: Diagnosis not present

## 2016-05-06 DEATH — deceased

## 2016-12-31 ENCOUNTER — Encounter (INDEPENDENT_AMBULATORY_CARE_PROVIDER_SITE_OTHER): Payer: Self-pay | Admitting: *Deleted

## 2017-06-22 IMAGING — CT CT CHEST W/O CM
3 of 4 series · 16 of 30 positions shown, 18 images · non-contrast
Comparison: Chest radiographs 06/19/2014 and CT 02/12/2011

CLINICAL DATA: Sternal chest pain and intermittent distal sternal
bulge for 6 months. Prior CABG. Cough.

EXAM:
CT CHEST WITHOUT CONTRAST
TECHNIQUE: Multidetector CT imaging of the chest was performed following the
standard protocol without IV contrast.

[Series 3: chest w/o · axial · non-contrast · 0.74mm/px · z∈[-220,-10]mm · 5 of 64 slices shown, 7 images]
[im 11/64  mediastinal]
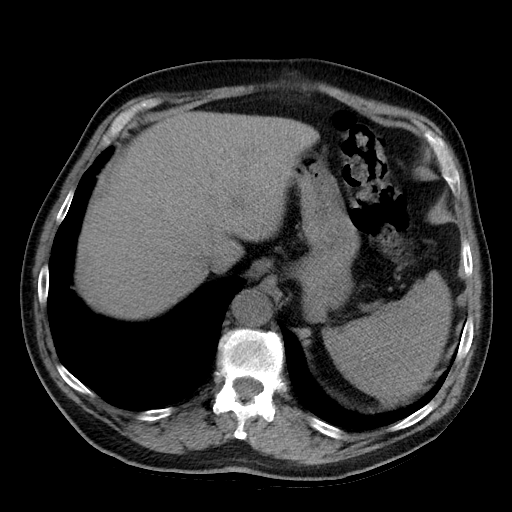
[im 11/64  lung]
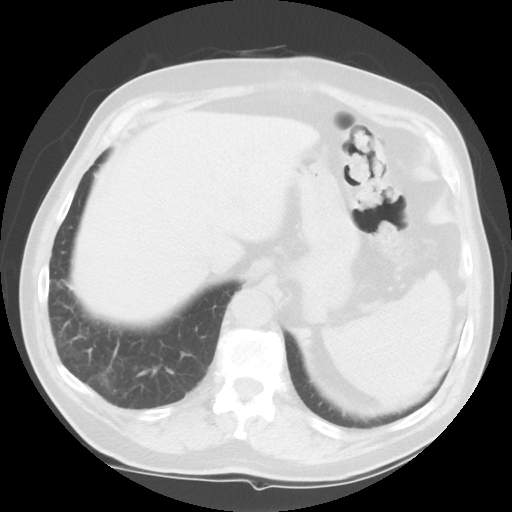
[im 22/64  lung]
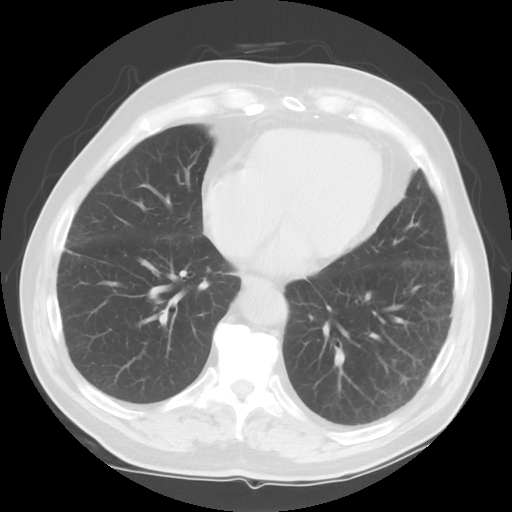
[im 32/64  lung]
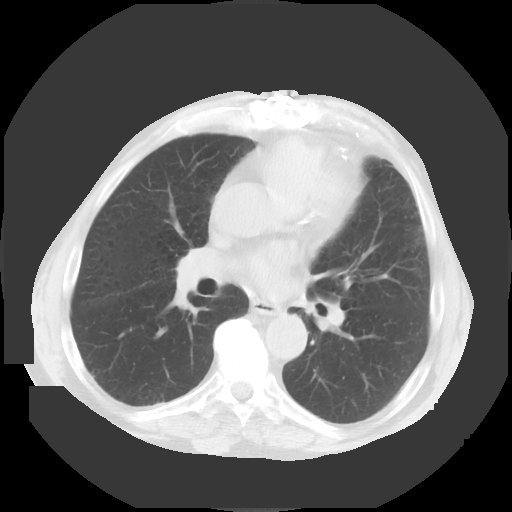
[im 43/64  lung]
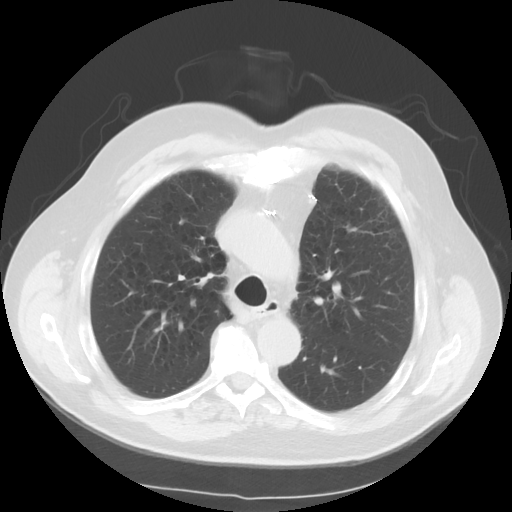
[im 53/64  mediastinal]
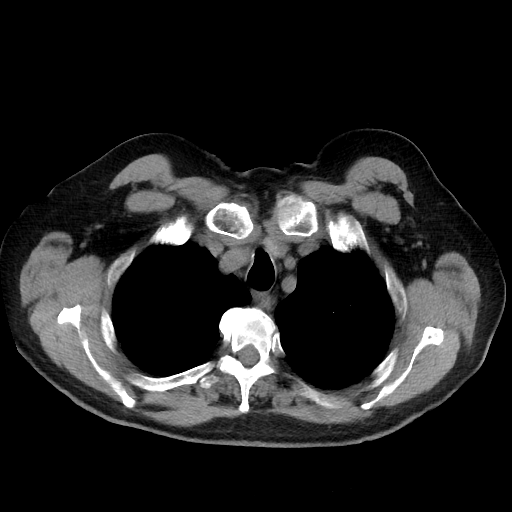
[im 53/64  lung]
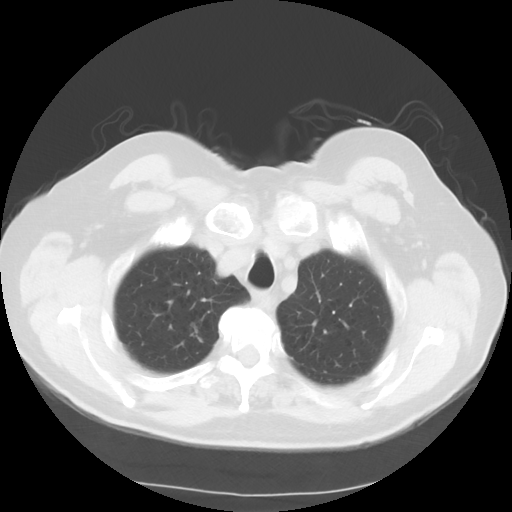

[Series 4: lung windows · axial · 0.74mm/px · z∈[-210,-20]mm · 4 of 64 slices shown]
[im 13/64  lung]
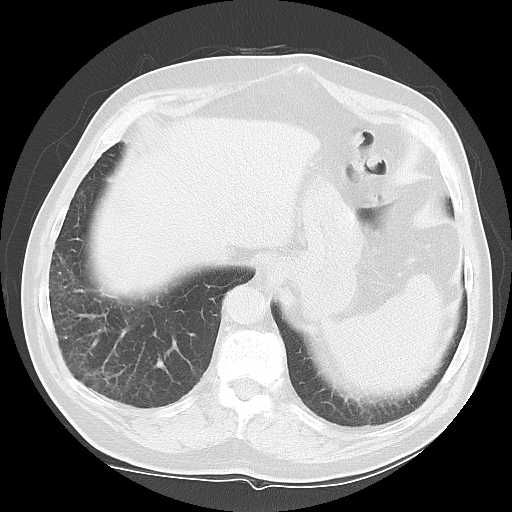
[im 26/64  lung]
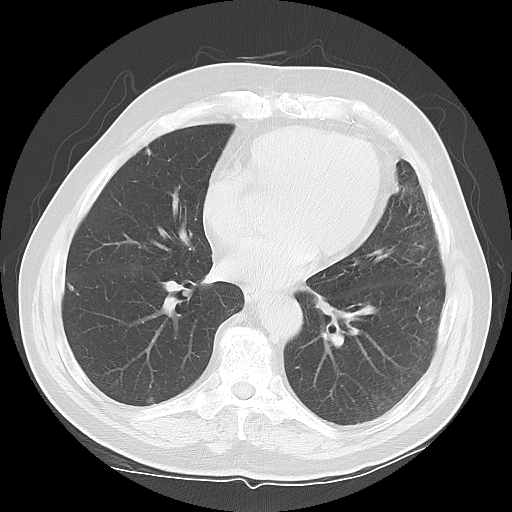
[im 38/64  lung]
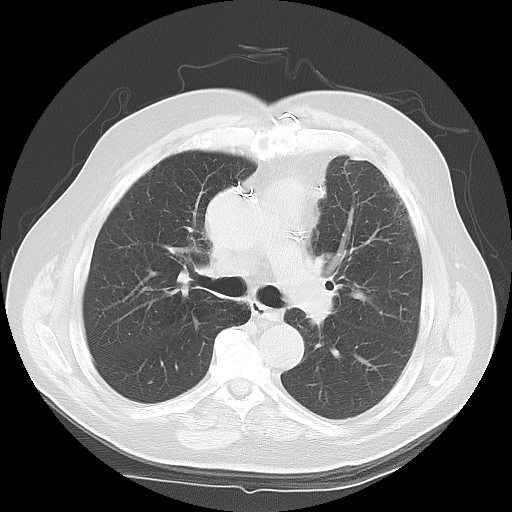
[im 51/64  lung]
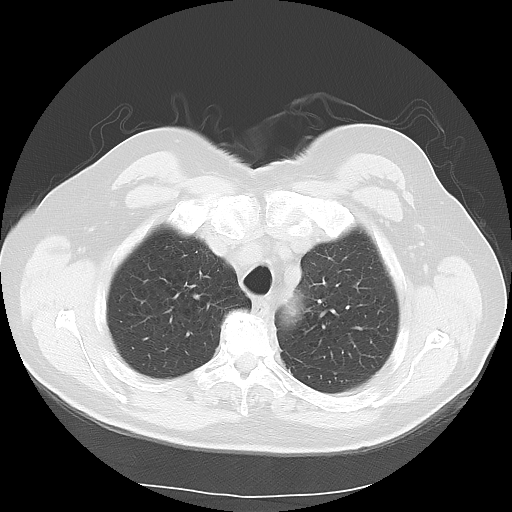

[Series 602: sagittal body · sagittal · 0.74mm/px · 7 of 153 slices shown]
[im 11/153  mediastinal]
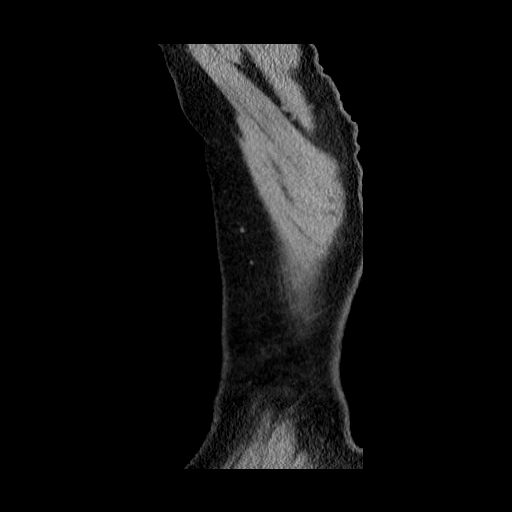
[im 33/153  mediastinal]
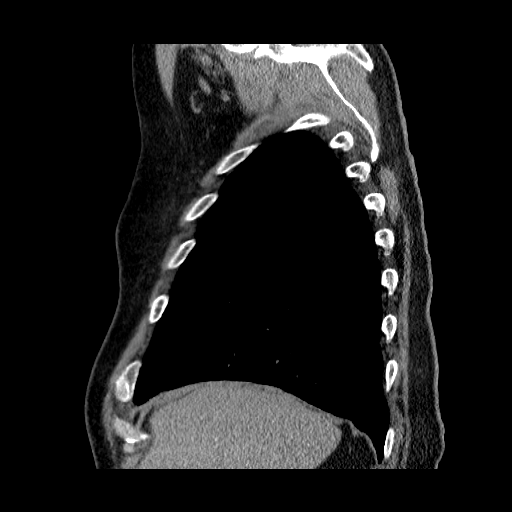
[im 55/153  mediastinal]
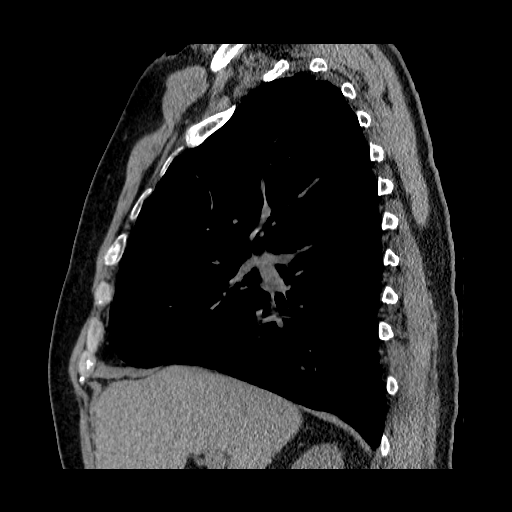
[im 66/153  mediastinal]
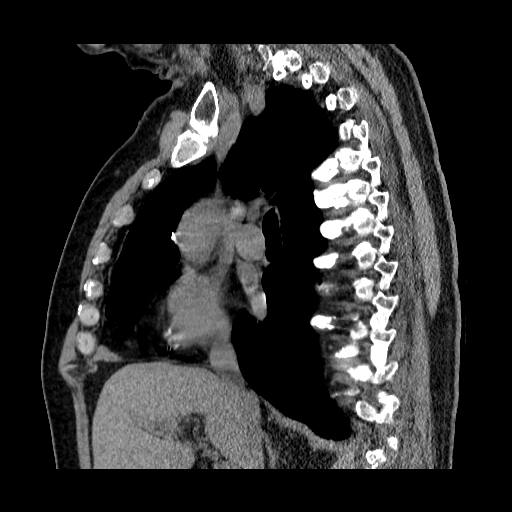
[im 87/153  mediastinal]
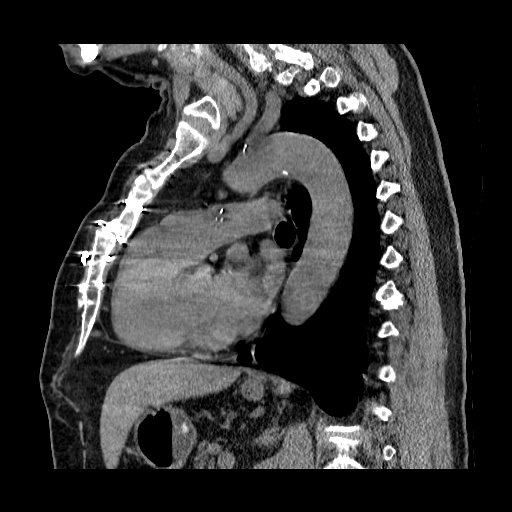
[im 98/153  mediastinal]
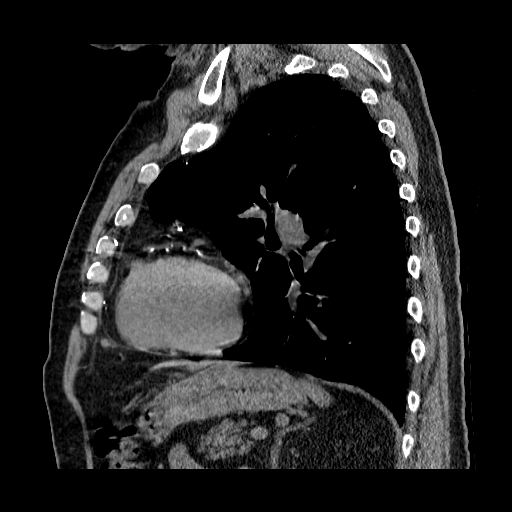
[im 120/153  mediastinal]
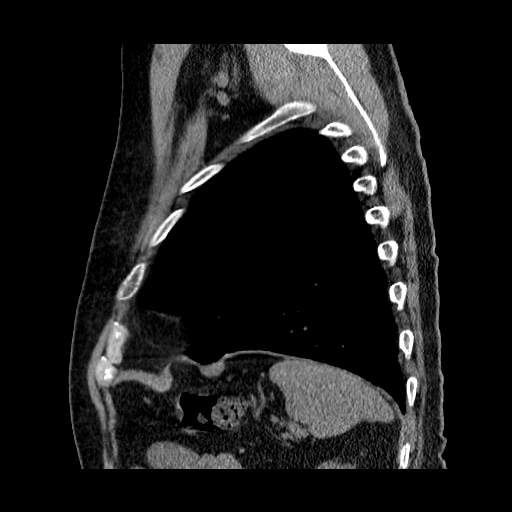

[16 of 30 positions shown; findings below may reference images not displayed]

FINDINGS: No enlarged axillary, mediastinal, or hilar lymph nodes are
identified. Mild aortic and coronary artery calcification are noted.
There is no pleural or pericardial effusion.

Mild-to-moderate centrilobular emphysema is noted. Areas of mild
subpleural reticulation and mild ground-glass opacity are similar to
the prior CT and may reflect mild fibrotic change.

4 mm right upper lobe nodule (series 4, image 32), 4 mm right middle
lobe nodule (image 38), 5 mm right lower lobe nodule (image 49), 6
mm right lower lobe nodule (image 40), 9 mm nodule along the right
major fissure (image 38), 4 mm and 3 mm nodules just posterior to
the right major fissure (images 37 and 39), 3 mm left upper lobe
nodule (image 15), and 8 mm lingular nodule/focal atelectasis (image
38) are all unchanged and considered benign. No new lung nodules are
identified.

Sequelae of prior CABG are identified. The inferior-most sternal
wire is again seen to be fractured. The sternotomy is approximated
superiorly with some areas of solid bone traversing the sternotomy.
There is mild residual distraction and nonunion at the inferior
aspect of the sternotomy which is similar to the prior CT. The
xiphoid process of the sternum is bifid and particularly elongated
on the left where it curves anteriorly. The tip of the left xiphoid
process is approximately 1 cm below the skin surface and is in the
region of clinical concern indicated by the vitamin E marker placed
on the skin. The configuration of the xiphoid process has not
significantly changed from 9789. Immediately inferior to the tip of
the left the xiphoid process is a small fat containing midline
ventral upper abdominal wall hernia which measures 2.8 cm in
diameter and is unchanged from 9789.
IMPRESSION: 1. Stable appearance of the sternum with changes of prior median
sternotomy as above. The xiphoid process is bifid, with the left
xiphoid process being elongated and anteriorly curved, terminating
just below the skin surface.
2. Small fat containing upper abdominal ventral hernia just below
the tip of the left-sided xiphoid process, also unchanged.
3. Unchanged lung nodules.  Emphysema.
